# Patient Record
Sex: Female | Born: 1965 | Race: White | Hispanic: No | Marital: Married | Smoking: Current every day smoker
Health system: Southern US, Community
[De-identification: ages and names within clinical notes are randomized; demographics above are authoritative.]

## PROBLEM LIST (undated history)

## (undated) DIAGNOSIS — I219 Acute myocardial infarction, unspecified: Secondary | ICD-10-CM

## (undated) DIAGNOSIS — G610 Guillain-Barre syndrome: Secondary | ICD-10-CM

## (undated) DIAGNOSIS — I2510 Atherosclerotic heart disease of native coronary artery without angina pectoris: Secondary | ICD-10-CM

## (undated) DIAGNOSIS — G9050 Complex regional pain syndrome I, unspecified: Secondary | ICD-10-CM

## (undated) DIAGNOSIS — I10 Essential (primary) hypertension: Secondary | ICD-10-CM

## (undated) DIAGNOSIS — E119 Type 2 diabetes mellitus without complications: Secondary | ICD-10-CM

## (undated) DIAGNOSIS — Z720 Tobacco use: Secondary | ICD-10-CM

## (undated) DIAGNOSIS — E785 Hyperlipidemia, unspecified: Secondary | ICD-10-CM

## (undated) HISTORY — DX: Essential (primary) hypertension: I10

## (undated) HISTORY — DX: Tobacco use: Z72.0

## (undated) HISTORY — DX: Acute myocardial infarction, unspecified: I21.9

## (undated) HISTORY — PX: DILATION AND CURETTAGE OF UTERUS: SHX78

## (undated) HISTORY — DX: Complex regional pain syndrome I, unspecified: G90.50

## (undated) HISTORY — DX: Type 2 diabetes mellitus without complications: E11.9

## (undated) HISTORY — DX: Hyperlipidemia, unspecified: E78.5

## (undated) HISTORY — DX: Guillain-Barre syndrome: G61.0

## (undated) HISTORY — DX: Atherosclerotic heart disease of native coronary artery without angina pectoris: I25.10

---
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
## 1999-10-29 ENCOUNTER — Emergency Department (HOSPITAL_COMMUNITY): Admission: EM | Admit: 1999-10-29 | Discharge: 1999-10-29 | Payer: Self-pay | Admitting: Emergency Medicine

## 2000-04-27 ENCOUNTER — Other Ambulatory Visit: Admission: RE | Admit: 2000-04-27 | Discharge: 2000-04-27 | Payer: Self-pay | Admitting: Oncology

## 2000-12-06 HISTORY — PX: CARPAL TUNNEL RELEASE: SHX101

## 2003-12-07 HISTORY — PX: TUBAL LIGATION: SHX77

## 2004-10-12 ENCOUNTER — Ambulatory Visit (HOSPITAL_COMMUNITY): Admission: RE | Admit: 2004-10-12 | Discharge: 2004-10-12 | Payer: Self-pay | Admitting: Neurology

## 2004-12-06 HISTORY — PX: CHOLECYSTECTOMY: SHX55

## 2005-05-23 IMAGING — NM NM BONE 3 PHASE
2 series · 12 of 12 positions shown · non-contrast
Comparison: none

CLINICAL DATA: 38-year-old with left foot pain.  Patient has history of bed rest during her pregnancy.
 NM 3-PHASE BONE SCAN:
 The patient received 25 mCi 5c-FFm MDP IV.  Flow, immediate static and delayed images were obtained.  These are correlated with plain films of the foot.  
 The flow images demonstrate no definite asymmetry in the flow to either foot.  I do not see any significant abnormality on the immediate static images either.  However, on delayed images at 3 hours, there is marked diffuse abnormal periarticular uptake in the left ankle and foot and also in the left knee.  This, in combination with the plain film finding would be highly suggestive of a reflex sympathetic dystrophy (causalgia).

[Series 1: bf bone flow · 9.87mm/px · 6 of 48 frames shown (1 of 2)]
[frame 5/48]
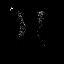
[frame 13/48]
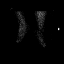
[frame 21/48]
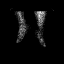
[frame 29/48]
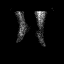
[frame 37/48]
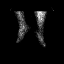
[frame 45/48]
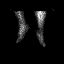

[Series 1: bf bone flow · 9.92mm/px · 6 of 48 frames shown (2 of 2)]
[frame 5/48]
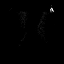
[frame 13/48]
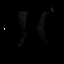
[frame 21/48]
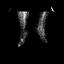
[frame 29/48]
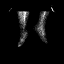
[frame 37/48]
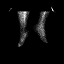
[frame 45/48]
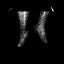

[12 of 12 positions shown; findings below may reference images not displayed]

IMPRESSION: 1.  Findings highly suggestive of a reflex sympathetic dystrophy involving the left lower extremity.

## 2008-12-06 HISTORY — PX: CORONARY STENT PLACEMENT: SHX1402

## 2009-06-05 HISTORY — PX: CARDIAC CATHETERIZATION: SHX172

## 2009-06-05 HISTORY — PX: CORONARY ANGIOPLASTY WITH STENT PLACEMENT: SHX49

## 2009-09-09 ENCOUNTER — Ambulatory Visit: Payer: Self-pay | Admitting: Cardiovascular Disease

## 2009-09-16 ENCOUNTER — Telehealth: Payer: Self-pay | Admitting: Cardiovascular Disease

## 2009-09-24 ENCOUNTER — Telehealth: Payer: Self-pay | Admitting: Cardiovascular Disease

## 2009-10-09 ENCOUNTER — Ambulatory Visit: Payer: Self-pay | Admitting: Cardiovascular Disease

## 2010-02-04 ENCOUNTER — Ambulatory Visit: Payer: Self-pay | Admitting: Cardiovascular Disease

## 2010-07-20 ENCOUNTER — Ambulatory Visit: Payer: Self-pay | Admitting: Cardiovascular Disease

## 2010-12-15 ENCOUNTER — Telehealth: Payer: Self-pay | Admitting: Cardiovascular Disease

## 2011-01-07 NOTE — Progress Notes (Signed)
  Phone Note Outgoing Call   Call placed by: Dessie Coma  LPN,  December 15, 2010 2:38 PM Call placed to: Patient Summary of Call: LMVM-for patient to return call.  Cholesterol is worse- is she still taking Lipitor?   Follow-up for Phone Call        Patient returned call- has been off of Lipitor because  of an ecoli  infection.  Just started back on Lipitor for 2 days. Follow-up by: Dessie Coma  LPN,  December 15, 2010 4:11 PM

## 2011-01-21 ENCOUNTER — Ambulatory Visit (INDEPENDENT_AMBULATORY_CARE_PROVIDER_SITE_OTHER): Payer: Medicare Other | Admitting: Cardiovascular Disease

## 2011-01-21 DIAGNOSIS — F17200 Nicotine dependence, unspecified, uncomplicated: Secondary | ICD-10-CM

## 2011-01-21 DIAGNOSIS — I2510 Atherosclerotic heart disease of native coronary artery without angina pectoris: Secondary | ICD-10-CM

## 2011-01-21 DIAGNOSIS — I10 Essential (primary) hypertension: Secondary | ICD-10-CM

## 2011-01-21 DIAGNOSIS — E7800 Pure hypercholesterolemia, unspecified: Secondary | ICD-10-CM

## 2011-02-26 NOTE — Assessment & Plan Note (Signed)
[facility]                       [place] CARDIOLOGY OFFICE NOTE  NAME:[name], [name]                       MRN:          [id number] DATE:01/21/2011                            DOB:          20-Dec-1965   [name] is a 45 year old female who is here today for a followup visit.  She has the following problem list: 1. Coronary artery disease status post acute myocardial infarction in     July 2010.  She is status post emergent angioplasty and a bare-     metal stent placement to the right coronary artery.  The stent was     Vision 4.0 x 23 mm. 2. Tobacco use. 3. Hypertension. 4. Type 2 diabetes. 5. Hyperlipidemia. 6. Reflexive sympathetic dystrophy.  INTERVAL HISTORY:  Ms. Kirksey overall has been doing reasonably well since her last visit.  Unfortunately, she is smoking again but not as much as before.  She smokes one-third of a pack per day.  She is not able to quit at this time.  She had only one episode of chest pain in the last 6 months that required nitroglycerin.  She has chronic exertional dyspnea.  She was off Lipitor for few weeks while she was taken antibiotic due to an interaction.  She is back on it right now.  MEDICATIONS: 1. Protonix 40 mg once daily. 2. Furosemide 20 mg once daily. 3. Lipitor 80 mg once daily. 4. Metoprolol succinate 25 mg once daily. 5. Fentanyl patch. 6. Insulin. 7. Lisinopril 2.5 mg once daily which will be increased today to 10 mg     once daily. 8. Fish oil 1200 mg once daily. 9. Folic acid once daily. 10.Aspirin 325 mg once daily.  PHYSICAL EXAMINATION:  VITAL SIGNS:  Weight is 168.8 pounds, blood pressure is 155/65, pulse is 63, and oxygen saturation is 98% on room air. NECK:  No JVD or carotid bruits. LUNGS:  Clear to auscultation. HEART:  Regular rate and rhythm with no gallops or murmurs. ABDOMEN:  Benign, nontender, nondistended. EXTREMITIES:  With no clubbing, cyanosis, or  edema.  IMPRESSION: 1. Coronary artery disease:  She seems to be stable at this time with     no symptoms suggestive of angina.  We will continue with current     medications. 2. Hypertension:  Blood pressure is elevated.  She is only on a small     dose lisinopril and thus we will increase the dose to 10 mg once     daily. 3. Hyperlipidemia:  Her most recent lipid profile was done while she     was not taking Lipitor and that probably explains why it was     significantly worse than before.  Her lipid profile was within     acceptable range while she was taking Lipitor.  It is will be     rechecked with her primary care physician in few months. 4. Tobacco use:  The patient was counseled about the importance of     smoking cessation.  She will follow up with me in 6 months from now     or earlier if needed.  Lorine Bears, MD Electronically Signed   MA/MedQ  DD: 01/21/2011  DT: 01/22/2011  Job #: [id number]

## 2011-04-15 ENCOUNTER — Other Ambulatory Visit: Payer: Self-pay | Admitting: Cardiovascular Disease

## 2011-04-20 NOTE — Assessment & Plan Note (Signed)
[place] [facility]                        [place] CARDIOLOGY OFFICE NOTE   NAME:[name]                       MRN:          [id number]  DATE:07/20/2010                            DOB:          [birth year]    PROBLEM LIST:  1. Coronary artery disease status post acute myocardial infarction in      July of 2010.  Status post emergent angioplasty and a bare-metal      stent placement to the right coronary artery.  The stent was Vision      stent 4.0 x 23 mm.  2. Hypertension.  3. Type 2 diabetes.  4. Hyperlipidemia.  5. Reflex sympathetic dystrophy.   INTERVAL HISTORY:  The patient is here today for routine followup visit.  Overall, she has been doing very well from a cardiac standpoint.  She  denies any chest pain, dyspnea, palpitations, dizziness, presyncope, or  syncope.  She quit smoking about 6 months ago.  However, she  occasionally smokes a cigarette here and there.  She is not able to do  much physical exercise due to RSD in the left leg.   CURRENT MEDICATIONS:  1. Protonix 40 mg daily.  2. Furosemide 20 mg daily.  3. Lipitor 80 mg once daily.  4. Metoprolol succinate 25 mg once daily.  5. Fentanyl patch.  6. Lyrica 100 mg twice daily.  7. Insulin sliding scale.  8. Insulin Levemir 60 units twice daily.  9. Aspirin 325 mg once daily.  10.Lisinopril 2.5 mg once daily.  11.Fish oil 1200 mg once daily.  12.Folic acid once daily.   ALLERGIES:  No known drug allergies.   PHYSICAL EXAMINATION:  GENERAL:  Overall, she appears to be in no acute  distress.  VITAL SIGNS:  Weight is 166.8 pounds, blood pressure is 153/64, pulse is  68, oxygen saturation is 92% on room air.  NECK:  No JVD or carotid bruits.  LUNGS:  Clear to auscultation and percussion.  CARDIOVASCULAR:  Regular rate and rhythm with no gallops or murmurs.  ABDOMEN:  Benign, nontender, nondistended.  EXTREMITIES:  With no clubbing, cyanosis or edema.  The left leg is  extremely sensitive to touch.  This is due to her known history of RSD.   IMPRESSION:  1. Coronary artery disease:  The patient is not having any symptoms of      angina at this time.  She seems to be doing well.  We will continue      with daily aspirin, beta-blocker, angiotensin-converting enzyme      inhibitor, statin, and fish oil.  2. Hypertension:  Blood pressure is well controlled and we will      continue with current management.  3. Hyperlipidemia:  Her most recent lipid profile showed an LDL of 55,      HDL of 70, and triglyceride of 75.  Obviously, this is under      excellent control.  We will continue with high-dose Lipitor.  4. Type 2 diabetes.  This has not been under well controlled, but is      being managed by her primary care  physician and seems to be      gradually improving.  The patient will return in 6 months from now      or earlier if needed.     Lorine Bears, MD  Electronically Signed    MA/MedQ  DD: 07/20/2010  DT: 07/21/2010  Job #: [id number]

## 2011-04-20 NOTE — Letter (Signed)
September 09, 2009    Philemon Kingdom, MD  [address]  [place] [address]   RE:  [name], [name]  MRN:  [id number]  /  DOB:  03/10/1966   Dear Dr. Sudie Bailey:   I had the pleasure of seeing your patient [name] in cardiology  clinic today.  As you know, she is a 45 year old white female who in  July 2010 had a myocardial infarction and subsequent percutaneous  intervention with a stent placed in her right coronary artery.  Patient  states that since that time, she has had intermittent chest discomfort  and diaphoresis not necessarily associated with one another.  Although I  doubt the likelihood of these symptoms representing angina, we will  proceed with a dobutamine echocardiogram in order to provide objective  evidence.  I will see her back in clinic after the results of the stress  test are available, and we will discuss potential modification of her  antiplatelet and antianginal regimens.   Thank you for the referral of this patient.  I look forward to following  her along with you.    Sincerely,      Brayton El, MD  Electronically Signed    SGA/MedQ  DD: 09/09/2009  DT: 09/09/2009  Job #: [id number]

## 2011-04-20 NOTE — Assessment & Plan Note (Signed)
HEALTHCARE                        [place] CARDIOLOGY OFFICE NOTE   NAME:Essman, GENEVA BARRERO                       MRN:          [id number]  DATE:10/09/2009                            DOB:          Jan 13, 1966    PROBLEM LIST:  1. Coronary artery disease status post myocardial infarction in July      2010 with a Vision bare-metal stent (4 x 23 mm) to her right      coronary artery.  2. Hypertension.  3. Diabetes.  4. Hyperlipidemia.  5. Reflex sympathetic dystrophy.   INTERVAL HISTORY:  The patient states since her last visit she has done  quite well.  She had a nuclear stress test at that showed a normal EF  and a fixed anterior septal defect with no evidence of inducible  ischemia.  She states that she has not used any nitroglycerin at all  since her last visit and has not had any episodes of chest discomfort.  She continues to get diaphoresis and hot flashes almost on a daily  basis.  She continues to have pain in her left lower extremity secondary  to the reflex sympathetic dystrophy.  She denies any increase in lower  extremity edema.  She continues to smoke approximately half pack of  cigarettes a day as she states this is one of the few things that she  still enjoys.  She is requesting that any medication that she does not  need be stopped secondary to financial constraints.   SOCIAL HISTORY:  The patient continues to smoke half pack of cigarettes  and does not use alcohol.   FAMILY HISTORY:  Negative for premature coronary artery disease.   ALLERGIES AND MEDICATIONS:  As in the chart.   PHYSICAL EXAMINATION:  VITAL SIGNS:  She has a blood pressure of 121/59,  pulse 74, weight 174, she is sating 94% on room air.  GENERAL:  No acute distress.  HEENT:  Nonfocal.  NECK:  Supple.  There is no JVD.  There are no carotid bruits.  HEART:  Regular rate and rhythm without murmur, rub or gallop.  LUNGS:  Clear bilaterally.  ABDOMEN:  Soft,  nontender, nondistended.  EXTREMITIES:  There is no lower extremity edema.  SKIN:  Warm and dry.  PSYCHIATRIC:  The patient is appropriate with normal levels of insight.   Review of the patient's stress test, as above in the problem list.  It  should be noted that there was a fixed anterior septal defect; however,  it is most likely secondary to breast attenuation.  Her ejection  fraction was 75%.   ASSESSMENT AND PLAN:  1. Coronary artery disease.  The patient is on aspirin, Effient, beta-      blocker, ACE inhibitor, and nystatin.  The patient may stop taking      her Effient as she is greater than 3 months out from placement of      bare-metal stent.  She should continue on aspirin, however.  2. Hypertension.  Her blood pressure is under good control.  She      should continue metoprolol and  lisinopril at current doses.  3. Hyperlipidemia.  The patient is on Lipitor 80 mg daily.  We asked      that she bring her last fasting cholesterol profile to [place] for our      review.  4. Tobacco use.  The patient was counseled regarding tobacco      cessation.  She states she has tried Chantix in the past, but was      not able to tolerate secondary to nightmares.  Wellbutrin also was      not successful.  She states at this point she is not ready to quit      smoking as she drives too much enjoyment from it.  5. Diabetes.  The patient states her blood sugars are still under      great control.  We recommend that she either followup with her      primary care physician more regularly or see an endocrinologist to      improve blood sugar control.  The patient was given samples of      Lipitor today in clinic.  We will see her back in 4 months' time      unless problem were to arise in the interim.     Brayton El, MD  Electronically Signed    SGA/MedQ  DD: 10/09/2009  DT: 10/10/2009  Job #: [id number]

## 2011-04-20 NOTE — Assessment & Plan Note (Signed)
[facility]                        [place] CARDIOLOGY OFFICE NOTE   NAME:[name], [name]                       MRN:          [id number]  DATE:09/09/2009                            DOB:          [birth year]    CHIEF COMPLAINT:  Establishing cardiovascular care here.   HISTORY OF PRESENT ILLNESS:  [name] is a 45 year old white female  with past medical history significant for coronary artery disease status  post acute myocardial infarction in July 2010 with subsequent placement  of a Vision bare-metal stent (4 x 23 mm) to her right coronary artery,  hypertension, diabetes, hyperlipidemia, reflex sympathetic dystrophy who  is presenting to establish cardiovascular care.  The patient states that  in July 2010, she had acute onset of chest discomfort and diaphoresis  that led to her receiving a bare-metal stent in her right coronary  artery.  Per review of the hospital record, her troponin peaked at 6.  Her ejection fraction at that time was 60%.  The patient states that  since her percutaneous intervention, she has had persistent intermittent  chest discomfort as well as a diaphoresis; however, the 2 symptoms are  not associated with each other.  They happen several times a week.  She  states over the past 4 months, she maybe used nitroglycerin 8 times.  However, when she used it, it relieved the chest pain promptly.  She  endorses some chronic mild lower extremity edema, but denies any PND or  orthopnea.  Of note, she used to use a wheelchair secondary to  difficulty ambulation ambulating from the RSD.  However, she currently  is able to ambulate successfully without assistance.   PAST MEDICAL HISTORY:  As above in HPI.  The patient also carries a  diagnosis of myelodysplastic syndrome, unspecified.   SOCIAL HISTORY:  The patient continues to smoke about a pack of  cigarettes a day and does not use any alcohol.   FAMILY HISTORY:  Negative for  premature coronary artery disease.   ALLERGIES:  No known drug allergies.   MEDICATIONS:  1. Aspirin 325 daily.  2. Effient 10 mg daily.  3. Lipitor 80 mg daily.  4. Furosemide 20 mg daily.  5. Pantoprazole 40 mg daily.  6. Fentanyl patch.  7. Metoprolol succinate ER 25 mg daily.  8. Lisinopril 2.5 mg daily.  9. Humulin insulin.  10.Lyrica 100 mg daily.  11.Nitroglycerin p.r.n.  12.Oxycodone p.r.n.   REVIEW OF SYSTEMS:  Positive for occasional headaches, lower extremity  edema, frequent pain in her legs.  Other systems as in HPI, otherwise  negative.   PHYSICAL EXAMINATION:  VITAL SIGNS:  She has a blood pressure of 133/56,  heart rate is 72, weight is 176 pounds.  GENERAL:  No acute distress.  HEENT:  Normocephalic, atraumatic.  NECK:  Supple.  There is no JVD.  HEART:  Distant heart sounds.  Regular rate and rhythm without murmur,  rub or gallop.  LUNGS:  Clear bilaterally.  ABDOMEN:  Soft, nontender, nondistended.  EXTREMITIES:  Somewhat tender to palpation with a trace bilateral lower  extremity edema.  SKIN:  Warm and dry without evidence of rash.  PSYCHIATRIC:  The patient is appropriate with normal levels of insight.  MUSCULOSKELETAL:  The patient has 5/5 bilateral upper and lower  extremity strength.   EKG from today independently interpreted by myself demonstrates normal  sinus rhythm without any ST or T-wave abnormalities.  The patient's labs  are currently not available for review.   The patient's hospital course is as above in HPI.   ASSESSMENT:  A 45 year old white female with known cardiovascular  disease and persistent tobacco use, presenting with continued  intermittent chest discomfort and some occasional diaphoresis.  The  patient states these symptoms have been present since the time of the  heart catheterization.  Therefore, I am less inclined to think they are  actually secondary to angina.  However, the patient discontinued to  smoke and has  significant risk factors for progression of coronary  artery disease.   PLAN:  We will order a dobutamine echocardiogram in order to reevaluate  the patient's left ventricular ejection fraction and to rule out  inducible ischemia.  She should continue on her current medical regimen  until that time.  Of note, the patient received a bare-metal stent and  is still on full dose aspirin and Effient.  Over the next office visit,  we will discuss discontinuing Effient.  However, we will wait for the  results of stress test.  We will ask the patient to have her primary  care physician fax her labs including a fasting lipid profile for our  review.     Brayton El, MD  Electronically Signed    SGA/MedQ  DD: 09/09/2009  DT: 09/10/2009  Job #: [id number]

## 2011-04-20 NOTE — Assessment & Plan Note (Signed)
[place] [facility]                        [place] CARDIOLOGY OFFICE NOTE   NAME:[name], [name]                       MRN:          [id number]  DATE:02/04/2010                            DOB:          10-Jul-1966    PROBLEM LIST:  1. Coronary artery disease, status post myocardial infarction in July      2010 with a Vision bare metal stent (4 x 23 mm) to the right      coronary artery.  2. Hypertension.  3. Diabetes.  4. Hyperlipidemia.  5. Reflex sympathetic dystrophy.   INTERVAL HISTORY:  The patient states since her last visit she had been  getting along well.  She denies any chest discomfort, dyspnea on  exertion, or any change in her chronic lower extremity edema.  Her  activity continues to be limited by pain from the reflex sympathetic  dystrophy in her left leg.  She states that she quit smoking several  days ago for her husband's birthday and is making significant efforts to  sustain that.  She has denied medical intervention for the tobacco  cessation in the past.   PHYSICAL EXAMINATION:  VITAL SIGNS:  Her blood pressure is 127/65, pulse  is 71, she is sating 95% on room air, and she weighs 183 pounds which is  about 8 pounds more than she weighed in November 2010.  GENERAL:  No acute distress.  HEENT:  Normocephalic, atraumatic.  NECK:  Supple.  There is no JVD in the seated position.  There are no  carotid bruits.  HEART:  Regular rate and rhythm without murmur, rub, or gallop.  LUNGS:  Clear bilaterally.  ABDOMEN:  Soft, nontender.  EXTREMITIES:  Trace bilateral lower extremity edema.  SKIN:  Warm and dry.  Pulses 2+ bilateral radial and posterior tibial  pulses.  PSYCHIATRIC:  The patient is appropriate.   Review of the patient's labs from December 2010, triglycerides 75, HDL  70, LDL 55, total cholesterol 027.   ASSESSMENT AND PLAN:  1. Coronary artery disease.  The patient is not having any angina.      She should continue on  aspirin, beta-blocker, ACE inhibitor,      statin, and fish oil.  The patient states she is taking 1200 a day      of fish oil.  We asked that she check to make sure she is taking at      least 1000 mg of a combination of EPA and DHA.  2. Hypertension.  Her blood pressure is under excellent control.  She      should continue with current medications.  3. Hyperlipidemia.  Her lipid profile is excellent.  She should      continue on Lipitor 80 mg daily.  She is given a co-pay card and      couple weeks samples today.  4. Tobacco use.  The patient is congratulated on efforts she is making      to stop tobacco use and encouraged to continue with this.  5. Diabetes.  The patient's blood glucose control has been difficult  per her primary care physician.  She should continue to follow up      regularly with Dr. Sudie Bailey regarding this issue.  6. Weight gain.  The patient is encouraged to perform some physical      activity as the pain in her left leg allows.  She is also      encouraged to continue efforts such as dietary modification.  She      has seen many nutritionists in the past.  We will see the patient      back in 4 months' time unless if problem were to arise in the      interim.     Brayton El, MD  Electronically Signed    SGA/MedQ  DD: 02/04/2010  DT: 02/05/2010  Job #: [id number]   cc:   Philemon Kingdom

## 2011-07-05 ENCOUNTER — Encounter: Payer: Self-pay | Admitting: Cardiovascular Disease

## 2011-07-08 ENCOUNTER — Encounter: Payer: Self-pay | Admitting: Cardiovascular Disease

## 2011-07-15 ENCOUNTER — Ambulatory Visit: Payer: BC Managed Care – PPO | Admitting: Cardiovascular Disease

## 2011-07-19 ENCOUNTER — Ambulatory Visit: Payer: BC Managed Care – PPO | Admitting: Cardiovascular Disease

## 2011-07-22 ENCOUNTER — Encounter: Payer: Self-pay | Admitting: Cardiovascular Disease

## 2011-07-22 ENCOUNTER — Ambulatory Visit (INDEPENDENT_AMBULATORY_CARE_PROVIDER_SITE_OTHER): Payer: Medicare Other | Admitting: Cardiovascular Disease

## 2011-07-22 DIAGNOSIS — Z720 Tobacco use: Secondary | ICD-10-CM | POA: Insufficient documentation

## 2011-07-22 DIAGNOSIS — E785 Hyperlipidemia, unspecified: Secondary | ICD-10-CM | POA: Insufficient documentation

## 2011-07-22 DIAGNOSIS — I10 Essential (primary) hypertension: Secondary | ICD-10-CM

## 2011-07-22 DIAGNOSIS — R079 Chest pain, unspecified: Secondary | ICD-10-CM

## 2011-07-22 DIAGNOSIS — I2510 Atherosclerotic heart disease of native coronary artery without angina pectoris: Secondary | ICD-10-CM | POA: Insufficient documentation

## 2011-07-22 DIAGNOSIS — F17200 Nicotine dependence, unspecified, uncomplicated: Secondary | ICD-10-CM

## 2011-07-22 NOTE — Assessment & Plan Note (Signed)
I discussed with her again the importance of smoking cessation. Unfortunately, she's not able to quit at this time due to significant stress at home.

## 2011-07-22 NOTE — Patient Instructions (Signed)
Your physician recommends that you schedule a follow-up appointment in: 6 months  Your physician has requested that you have a lexiscan myoview. For further information please visit [email or web address] Please follow instruction sheet, as given.  ---- 08/03/11 @ 8:45

## 2011-07-22 NOTE — Assessment & Plan Note (Signed)
His blood pressure is well controlled. Continue current medications. Lisinopril was increased during her last visit to 10 mg daily.

## 2011-07-22 NOTE — Assessment & Plan Note (Signed)
Continue high dose atorvastatin. This is being managed by her primary care physician. Goal LDL is less than 70.

## 2011-07-22 NOTE — Assessment & Plan Note (Signed)
The patient is having increased frequency of chest pain. Some of it seems to be atypical and might be related to anxiety. However, she's been having increased exertional chest pain and dyspnea as well. Thus, I recommend further evaluation with a pharmacologic nuclear stress test. The patient is not able to exercise on a treadmill due to her known history of reflexive sympathetic dystrophy.

## 2011-07-22 NOTE — Progress Notes (Signed)
HPI  This is a 45 year old female who is here today for a followup visit. She has known history of coronary artery disease status post inferior myocardial infarction in July 2010. At that time she had an angioplasty in bare-metal stent placement to the right coronary artery. The patient has been having more episodes of chest pain recently. She describes substernal and left-sided sharp discomfort which is triggered by stress and anxiety. She has been under a lot of stress at home. She also describes exertional chest discomfort and dyspnea but according to her this has been chronic even before his myocardial infarction. She has not had any recent stress testing. Her most recent stress test was in October 2010 which showed no evidence of ischemia with normal ejection fraction. Unfortunately, she continues to smoke half a pack per day and is not able to quit at this time.  No Known Allergies   Current Outpatient Prescriptions on File Prior to Visit  Medication Sig Dispense Refill  . aspirin 325 MG tablet Take 325 mg by mouth daily.        Marland Kitchen atorvastatin (LIPITOR) 80 MG tablet Take 80 mg by mouth every evening.        . clonazePAM (KLONOPIN) 0.5 MG tablet Take 0.5 mg by mouth 2 (two) times daily as needed.        . folic acid (FOLVITE) 400 MCG tablet Take 400 mcg by mouth daily.        . furosemide (LASIX) 20 MG tablet Take 20 mg by mouth daily.        . insulin detemir (LEVEMIR) 100 UNIT/ML injection Inject 60 Units into the skin 2 (two) times daily.        . insulin regular (HUMULIN R,NOVOLIN R) 100 units/mL injection Twice daily & as needed       . nitroGLYCERIN (NITROSTAT) 0.4 MG SL tablet Place 0.4 mg under the tongue every 5 (five) minutes as needed.        . Omega-3 Fatty Acids (FISH OIL) 1200 MG CAPS Take by mouth daily.        Marland Kitchen oxyCODONE (ROXICODONE) 15 MG immediate release tablet Take 15 mg by mouth every 6 (six) hours as needed.        . pantoprazole (PROTONIX) 40 MG tablet Take 40 mg by  mouth daily.        . TOPROL XL 25 MG 24 hr tablet TAKE 1 TABLET ONCE DAILY  30 each  6     Past Medical History  Diagnosis Date  . MI (myocardial infarction)   . DM2 (diabetes mellitus, type 2)   . Sympathetic reflex dystrophy   . CAD (coronary artery disease)     s/p MI & stenting  . HLD (hyperlipidemia)   . HTN (hypertension)   . Tobacco abuse      Past Surgical History  Procedure Date  . Cholecystectomy 2006  . Carpal tunnel release 2002    left  . Dilation and curettage of uterus 1992 & 2004  . Tonsillectomy 1971  . Tubal ligation 2005  . Coronary stent placement 2010  . Cardiac catheterization 06/05/2009    Inferior MI. Severe 1 vessel CAD (RCA)  . Coronary angioplasty with stent placement 06/05/2009    BMS (4.0 X 23 mm) Vision stent to RCA     Family History  Problem Relation Age of Onset  . Huntington's disease       History   Social History  . Marital Status: Married  Spouse Name: N/A    Number of Children: 2  . Years of Education: N/A   Occupational History  . disabled    Social History Main Topics  . Smoking status: Current Everyday Smoker -- 0.8 packs/day for 25 years  . Smokeless tobacco: Not on file  . Alcohol Use: No  . Drug Use: No  . Sexually Active: Not on file   Other Topics Concern  . Not on file   Social History Narrative  . No narrative on file       PHYSICAL EXAM   BP 132/64  Pulse 62  Ht 4\' 10"  (1.473 m)  Wt 163 lb (73.936 kg)  BMI 34.07 kg/m2  SpO2 95%  Constitutional: She is oriented to person, place, and time. She appears well-developed and well-nourished. No distress.  HENT: No nasal discharge.  Head: Normocephalic and atraumatic.  Eyes: Pupils are equal, round, and reactive to light. Right eye exhibits no discharge. Left eye exhibits no discharge.  Neck: Normal range of motion. Neck supple. No JVD present. No thyromegaly present.  Cardiovascular: Normal rate, regular rhythm, normal heart sounds and  intact distal pulses. Exam reveals no gallop and no friction rub.  No murmur heard.  Pulmonary/Chest: Effort normal and breath sounds normal. No stridor. No respiratory distress. She has no wheezes. She has no rales. She exhibits no tenderness.  Abdominal: Soft. Bowel sounds are normal. She exhibits no distension. There is no tenderness. There is no rebound and no guarding.  Musculoskeletal: Normal range of motion. She exhibits no edema and no tenderness.  Neurological: She is alert and oriented to person, place, and time. Coordination normal.  Skin: Skin is warm and dry. No rash noted. She is not diaphoretic. No erythema. No pallor.  Psychiatric: She has a normal mood and affect. Her behavior is normal. Judgment and thought content normal.     EKG: Normal sinus rhythm with no significant ST or T wave changes.   ASSESSMENT AND PLAN

## 2011-07-26 ENCOUNTER — Other Ambulatory Visit: Payer: Self-pay | Admitting: Cardiovascular Disease

## 2011-07-27 ENCOUNTER — Encounter: Payer: Self-pay | Admitting: *Deleted

## 2011-08-04 ENCOUNTER — Encounter: Payer: Self-pay | Admitting: Cardiovascular Disease

## 2011-08-05 ENCOUNTER — Encounter: Payer: Self-pay | Admitting: Cardiovascular Disease

## 2011-08-28 ENCOUNTER — Other Ambulatory Visit: Payer: Self-pay | Admitting: Cardiovascular Disease

## 2011-08-29 ENCOUNTER — Other Ambulatory Visit: Payer: Self-pay | Admitting: *Deleted

## 2011-08-29 MED ORDER — ATORVASTATIN CALCIUM 80 MG PO TABS: 80.0000 mg | ORAL_TABLET | Freq: Every evening | ORAL | Status: AC

## 2011-12-04 ENCOUNTER — Other Ambulatory Visit: Payer: Self-pay | Admitting: Cardiovascular Disease

## 2012-01-05 DIAGNOSIS — M545 Low back pain, unspecified: Secondary | ICD-10-CM | POA: Diagnosis not present

## 2012-01-05 DIAGNOSIS — R5381 Other malaise: Secondary | ICD-10-CM | POA: Diagnosis not present

## 2012-01-05 DIAGNOSIS — G988 Other disorders of nervous system: Secondary | ICD-10-CM | POA: Diagnosis not present

## 2012-01-05 DIAGNOSIS — IMO0002 Reserved for concepts with insufficient information to code with codable children: Secondary | ICD-10-CM | POA: Diagnosis not present

## 2012-01-05 DIAGNOSIS — R5383 Other fatigue: Secondary | ICD-10-CM | POA: Diagnosis not present

## 2012-02-02 DIAGNOSIS — G90529 Complex regional pain syndrome I of unspecified lower limb: Secondary | ICD-10-CM | POA: Diagnosis not present

## 2012-02-02 DIAGNOSIS — E1149 Type 2 diabetes mellitus with other diabetic neurological complication: Secondary | ICD-10-CM | POA: Diagnosis not present

## 2012-02-02 DIAGNOSIS — E1142 Type 2 diabetes mellitus with diabetic polyneuropathy: Secondary | ICD-10-CM | POA: Diagnosis not present

## 2012-02-02 DIAGNOSIS — G988 Other disorders of nervous system: Secondary | ICD-10-CM | POA: Diagnosis not present

## 2012-03-16 DIAGNOSIS — M25569 Pain in unspecified knee: Secondary | ICD-10-CM | POA: Diagnosis not present

## 2012-03-16 DIAGNOSIS — M6750 Plica syndrome, unspecified knee: Secondary | ICD-10-CM | POA: Diagnosis not present

## 2012-03-16 DIAGNOSIS — M1710 Unilateral primary osteoarthritis, unspecified knee: Secondary | ICD-10-CM | POA: Diagnosis not present

## 2012-03-16 DIAGNOSIS — M7120 Synovial cyst of popliteal space [Baker], unspecified knee: Secondary | ICD-10-CM | POA: Diagnosis not present

## 2012-04-03 DIAGNOSIS — IMO0002 Reserved for concepts with insufficient information to code with codable children: Secondary | ICD-10-CM | POA: Diagnosis not present

## 2012-04-03 DIAGNOSIS — G90529 Complex regional pain syndrome I of unspecified lower limb: Secondary | ICD-10-CM | POA: Diagnosis not present

## 2012-04-03 DIAGNOSIS — G988 Other disorders of nervous system: Secondary | ICD-10-CM | POA: Diagnosis not present

## 2012-04-03 DIAGNOSIS — E1149 Type 2 diabetes mellitus with other diabetic neurological complication: Secondary | ICD-10-CM | POA: Diagnosis not present

## 2012-04-12 DIAGNOSIS — G587 Mononeuritis multiplex: Secondary | ICD-10-CM | POA: Diagnosis not present

## 2012-04-26 DIAGNOSIS — G587 Mononeuritis multiplex: Secondary | ICD-10-CM | POA: Diagnosis not present

## 2012-04-26 DIAGNOSIS — G608 Other hereditary and idiopathic neuropathies: Secondary | ICD-10-CM | POA: Diagnosis not present

## 2012-04-28 DIAGNOSIS — G603 Idiopathic progressive neuropathy: Secondary | ICD-10-CM | POA: Diagnosis not present

## 2012-05-02 DIAGNOSIS — E1142 Type 2 diabetes mellitus with diabetic polyneuropathy: Secondary | ICD-10-CM | POA: Diagnosis not present

## 2012-05-02 DIAGNOSIS — G988 Other disorders of nervous system: Secondary | ICD-10-CM | POA: Diagnosis not present

## 2012-05-02 DIAGNOSIS — IMO0001 Reserved for inherently not codable concepts without codable children: Secondary | ICD-10-CM | POA: Diagnosis not present

## 2012-05-02 DIAGNOSIS — E1149 Type 2 diabetes mellitus with other diabetic neurological complication: Secondary | ICD-10-CM | POA: Diagnosis not present

## 2012-06-06 DIAGNOSIS — G603 Idiopathic progressive neuropathy: Secondary | ICD-10-CM | POA: Diagnosis not present

## 2012-06-06 DIAGNOSIS — E1142 Type 2 diabetes mellitus with diabetic polyneuropathy: Secondary | ICD-10-CM | POA: Diagnosis not present

## 2012-06-14 DIAGNOSIS — G587 Mononeuritis multiplex: Secondary | ICD-10-CM | POA: Diagnosis not present

## 2012-06-14 DIAGNOSIS — M6281 Muscle weakness (generalized): Secondary | ICD-10-CM | POA: Diagnosis not present

## 2012-06-14 DIAGNOSIS — E1142 Type 2 diabetes mellitus with diabetic polyneuropathy: Secondary | ICD-10-CM | POA: Diagnosis not present

## 2012-06-14 DIAGNOSIS — G603 Idiopathic progressive neuropathy: Secondary | ICD-10-CM | POA: Diagnosis not present

## 2012-06-19 DIAGNOSIS — E1142 Type 2 diabetes mellitus with diabetic polyneuropathy: Secondary | ICD-10-CM | POA: Diagnosis not present

## 2012-06-19 DIAGNOSIS — G603 Idiopathic progressive neuropathy: Secondary | ICD-10-CM | POA: Diagnosis not present

## 2012-06-19 DIAGNOSIS — G587 Mononeuritis multiplex: Secondary | ICD-10-CM | POA: Diagnosis not present

## 2012-06-19 DIAGNOSIS — M6281 Muscle weakness (generalized): Secondary | ICD-10-CM | POA: Diagnosis not present

## 2012-06-26 DIAGNOSIS — G603 Idiopathic progressive neuropathy: Secondary | ICD-10-CM | POA: Diagnosis not present

## 2012-06-26 DIAGNOSIS — G587 Mononeuritis multiplex: Secondary | ICD-10-CM | POA: Diagnosis not present

## 2012-06-26 DIAGNOSIS — M6281 Muscle weakness (generalized): Secondary | ICD-10-CM | POA: Diagnosis not present

## 2012-06-26 DIAGNOSIS — E1142 Type 2 diabetes mellitus with diabetic polyneuropathy: Secondary | ICD-10-CM | POA: Diagnosis not present

## 2012-06-28 DIAGNOSIS — G603 Idiopathic progressive neuropathy: Secondary | ICD-10-CM | POA: Diagnosis not present

## 2012-06-28 DIAGNOSIS — G587 Mononeuritis multiplex: Secondary | ICD-10-CM | POA: Diagnosis not present

## 2012-06-28 DIAGNOSIS — E1142 Type 2 diabetes mellitus with diabetic polyneuropathy: Secondary | ICD-10-CM | POA: Diagnosis not present

## 2012-06-28 DIAGNOSIS — M6281 Muscle weakness (generalized): Secondary | ICD-10-CM | POA: Diagnosis not present

## 2012-07-03 DIAGNOSIS — M6281 Muscle weakness (generalized): Secondary | ICD-10-CM | POA: Diagnosis not present

## 2012-07-03 DIAGNOSIS — E1142 Type 2 diabetes mellitus with diabetic polyneuropathy: Secondary | ICD-10-CM | POA: Diagnosis not present

## 2012-07-03 DIAGNOSIS — G603 Idiopathic progressive neuropathy: Secondary | ICD-10-CM | POA: Diagnosis not present

## 2012-07-03 DIAGNOSIS — G587 Mononeuritis multiplex: Secondary | ICD-10-CM | POA: Diagnosis not present

## 2012-07-04 DIAGNOSIS — G988 Other disorders of nervous system: Secondary | ICD-10-CM | POA: Diagnosis not present

## 2012-07-04 DIAGNOSIS — M545 Low back pain, unspecified: Secondary | ICD-10-CM | POA: Diagnosis not present

## 2012-07-04 DIAGNOSIS — IMO0002 Reserved for concepts with insufficient information to code with codable children: Secondary | ICD-10-CM | POA: Diagnosis not present

## 2012-07-10 DIAGNOSIS — E1142 Type 2 diabetes mellitus with diabetic polyneuropathy: Secondary | ICD-10-CM | POA: Diagnosis not present

## 2012-07-10 DIAGNOSIS — E1149 Type 2 diabetes mellitus with other diabetic neurological complication: Secondary | ICD-10-CM | POA: Diagnosis not present

## 2012-07-12 DIAGNOSIS — M6281 Muscle weakness (generalized): Secondary | ICD-10-CM | POA: Diagnosis not present

## 2012-07-12 DIAGNOSIS — G603 Idiopathic progressive neuropathy: Secondary | ICD-10-CM | POA: Diagnosis not present

## 2012-07-12 DIAGNOSIS — G587 Mononeuritis multiplex: Secondary | ICD-10-CM | POA: Diagnosis not present

## 2012-07-12 DIAGNOSIS — E1142 Type 2 diabetes mellitus with diabetic polyneuropathy: Secondary | ICD-10-CM | POA: Diagnosis not present

## 2012-07-17 DIAGNOSIS — E1142 Type 2 diabetes mellitus with diabetic polyneuropathy: Secondary | ICD-10-CM | POA: Diagnosis not present

## 2012-07-17 DIAGNOSIS — G603 Idiopathic progressive neuropathy: Secondary | ICD-10-CM | POA: Diagnosis not present

## 2012-07-17 DIAGNOSIS — M6281 Muscle weakness (generalized): Secondary | ICD-10-CM | POA: Diagnosis not present

## 2012-07-17 DIAGNOSIS — G587 Mononeuritis multiplex: Secondary | ICD-10-CM | POA: Diagnosis not present

## 2012-07-26 DIAGNOSIS — G587 Mononeuritis multiplex: Secondary | ICD-10-CM | POA: Diagnosis not present

## 2012-07-26 DIAGNOSIS — M6281 Muscle weakness (generalized): Secondary | ICD-10-CM | POA: Diagnosis not present

## 2012-07-26 DIAGNOSIS — G603 Idiopathic progressive neuropathy: Secondary | ICD-10-CM | POA: Diagnosis not present

## 2012-07-26 DIAGNOSIS — E1142 Type 2 diabetes mellitus with diabetic polyneuropathy: Secondary | ICD-10-CM | POA: Diagnosis not present

## 2012-08-01 DIAGNOSIS — M6281 Muscle weakness (generalized): Secondary | ICD-10-CM | POA: Diagnosis not present

## 2012-08-01 DIAGNOSIS — G587 Mononeuritis multiplex: Secondary | ICD-10-CM | POA: Diagnosis not present

## 2012-08-01 DIAGNOSIS — E1142 Type 2 diabetes mellitus with diabetic polyneuropathy: Secondary | ICD-10-CM | POA: Diagnosis not present

## 2012-08-01 DIAGNOSIS — G603 Idiopathic progressive neuropathy: Secondary | ICD-10-CM | POA: Diagnosis not present

## 2012-08-03 DIAGNOSIS — M6281 Muscle weakness (generalized): Secondary | ICD-10-CM | POA: Diagnosis not present

## 2012-08-03 DIAGNOSIS — G603 Idiopathic progressive neuropathy: Secondary | ICD-10-CM | POA: Diagnosis not present

## 2012-08-03 DIAGNOSIS — E1142 Type 2 diabetes mellitus with diabetic polyneuropathy: Secondary | ICD-10-CM | POA: Diagnosis not present

## 2012-08-03 DIAGNOSIS — G587 Mononeuritis multiplex: Secondary | ICD-10-CM | POA: Diagnosis not present

## 2012-08-08 DIAGNOSIS — M6281 Muscle weakness (generalized): Secondary | ICD-10-CM | POA: Diagnosis not present

## 2012-08-08 DIAGNOSIS — G587 Mononeuritis multiplex: Secondary | ICD-10-CM | POA: Diagnosis not present

## 2012-08-08 DIAGNOSIS — E1142 Type 2 diabetes mellitus with diabetic polyneuropathy: Secondary | ICD-10-CM | POA: Diagnosis not present

## 2012-08-08 DIAGNOSIS — G603 Idiopathic progressive neuropathy: Secondary | ICD-10-CM | POA: Diagnosis not present

## 2012-08-21 DIAGNOSIS — G603 Idiopathic progressive neuropathy: Secondary | ICD-10-CM | POA: Diagnosis not present

## 2012-08-21 DIAGNOSIS — E1142 Type 2 diabetes mellitus with diabetic polyneuropathy: Secondary | ICD-10-CM | POA: Diagnosis not present

## 2012-08-21 DIAGNOSIS — G587 Mononeuritis multiplex: Secondary | ICD-10-CM | POA: Diagnosis not present

## 2012-08-21 DIAGNOSIS — M6281 Muscle weakness (generalized): Secondary | ICD-10-CM | POA: Diagnosis not present

## 2012-08-29 DIAGNOSIS — IMO0002 Reserved for concepts with insufficient information to code with codable children: Secondary | ICD-10-CM | POA: Diagnosis not present

## 2012-08-29 DIAGNOSIS — M545 Low back pain, unspecified: Secondary | ICD-10-CM | POA: Diagnosis not present

## 2012-08-29 DIAGNOSIS — G90529 Complex regional pain syndrome I of unspecified lower limb: Secondary | ICD-10-CM | POA: Diagnosis not present

## 2012-08-29 DIAGNOSIS — G988 Other disorders of nervous system: Secondary | ICD-10-CM | POA: Diagnosis not present

## 2012-09-11 DIAGNOSIS — R0602 Shortness of breath: Secondary | ICD-10-CM | POA: Diagnosis not present

## 2012-09-11 DIAGNOSIS — R079 Chest pain, unspecified: Secondary | ICD-10-CM | POA: Diagnosis not present

## 2012-10-10 DIAGNOSIS — M545 Low back pain, unspecified: Secondary | ICD-10-CM | POA: Diagnosis not present

## 2012-10-10 DIAGNOSIS — E1149 Type 2 diabetes mellitus with other diabetic neurological complication: Secondary | ICD-10-CM | POA: Diagnosis not present

## 2012-10-10 DIAGNOSIS — G988 Other disorders of nervous system: Secondary | ICD-10-CM | POA: Diagnosis not present

## 2012-11-27 DIAGNOSIS — G90529 Complex regional pain syndrome I of unspecified lower limb: Secondary | ICD-10-CM | POA: Diagnosis not present

## 2012-11-27 DIAGNOSIS — M545 Low back pain, unspecified: Secondary | ICD-10-CM | POA: Diagnosis not present

## 2012-11-27 DIAGNOSIS — IMO0002 Reserved for concepts with insufficient information to code with codable children: Secondary | ICD-10-CM | POA: Diagnosis not present

## 2012-11-27 DIAGNOSIS — G988 Other disorders of nervous system: Secondary | ICD-10-CM | POA: Diagnosis not present

## 2013-01-02 DIAGNOSIS — IMO0001 Reserved for inherently not codable concepts without codable children: Secondary | ICD-10-CM | POA: Diagnosis not present

## 2013-01-02 DIAGNOSIS — G988 Other disorders of nervous system: Secondary | ICD-10-CM | POA: Diagnosis not present

## 2013-01-02 DIAGNOSIS — E1149 Type 2 diabetes mellitus with other diabetic neurological complication: Secondary | ICD-10-CM | POA: Diagnosis not present

## 2013-01-02 DIAGNOSIS — IMO0002 Reserved for concepts with insufficient information to code with codable children: Secondary | ICD-10-CM | POA: Diagnosis not present

## 2013-02-05 DIAGNOSIS — M79609 Pain in unspecified limb: Secondary | ICD-10-CM | POA: Diagnosis not present

## 2013-02-05 DIAGNOSIS — G90529 Complex regional pain syndrome I of unspecified lower limb: Secondary | ICD-10-CM | POA: Diagnosis not present

## 2013-02-05 DIAGNOSIS — M25569 Pain in unspecified knee: Secondary | ICD-10-CM | POA: Diagnosis not present

## 2013-02-05 DIAGNOSIS — E1149 Type 2 diabetes mellitus with other diabetic neurological complication: Secondary | ICD-10-CM | POA: Diagnosis not present

## 2013-03-13 DIAGNOSIS — E1142 Type 2 diabetes mellitus with diabetic polyneuropathy: Secondary | ICD-10-CM | POA: Diagnosis not present

## 2013-03-13 DIAGNOSIS — E1049 Type 1 diabetes mellitus with other diabetic neurological complication: Secondary | ICD-10-CM | POA: Diagnosis not present

## 2013-03-13 DIAGNOSIS — G988 Other disorders of nervous system: Secondary | ICD-10-CM | POA: Diagnosis not present

## 2013-03-13 DIAGNOSIS — G90529 Complex regional pain syndrome I of unspecified lower limb: Secondary | ICD-10-CM | POA: Diagnosis not present

## 2013-07-03 DIAGNOSIS — E1149 Type 2 diabetes mellitus with other diabetic neurological complication: Secondary | ICD-10-CM | POA: Diagnosis not present

## 2013-07-03 DIAGNOSIS — IMO0002 Reserved for concepts with insufficient information to code with codable children: Secondary | ICD-10-CM | POA: Diagnosis not present

## 2013-07-03 DIAGNOSIS — E1142 Type 2 diabetes mellitus with diabetic polyneuropathy: Secondary | ICD-10-CM | POA: Diagnosis not present

## 2013-07-03 DIAGNOSIS — G894 Chronic pain syndrome: Secondary | ICD-10-CM | POA: Diagnosis not present

## 2013-07-18 ENCOUNTER — Encounter: Payer: Self-pay | Admitting: Cardiovascular Disease

## 2013-07-18 DIAGNOSIS — I2510 Atherosclerotic heart disease of native coronary artery without angina pectoris: Secondary | ICD-10-CM | POA: Diagnosis not present

## 2013-07-18 DIAGNOSIS — I10 Essential (primary) hypertension: Secondary | ICD-10-CM | POA: Diagnosis not present

## 2013-07-18 DIAGNOSIS — E1149 Type 2 diabetes mellitus with other diabetic neurological complication: Secondary | ICD-10-CM | POA: Diagnosis not present

## 2013-07-18 DIAGNOSIS — E782 Mixed hyperlipidemia: Secondary | ICD-10-CM | POA: Diagnosis not present

## 2013-07-18 DIAGNOSIS — J209 Acute bronchitis, unspecified: Secondary | ICD-10-CM | POA: Diagnosis not present

## 2013-07-31 ENCOUNTER — Other Ambulatory Visit: Payer: Self-pay

## 2013-07-31 ENCOUNTER — Encounter: Payer: Self-pay | Admitting: Cardiovascular Disease

## 2013-07-31 ENCOUNTER — Ambulatory Visit (INDEPENDENT_AMBULATORY_CARE_PROVIDER_SITE_OTHER): Payer: Medicare Other | Admitting: Cardiovascular Disease

## 2013-07-31 VITALS — BP 138/70 | HR 65 | Ht <= 58 in | Wt 167.1 lb

## 2013-07-31 DIAGNOSIS — I2510 Atherosclerotic heart disease of native coronary artery without angina pectoris: Secondary | ICD-10-CM | POA: Diagnosis not present

## 2013-07-31 DIAGNOSIS — I10 Essential (primary) hypertension: Secondary | ICD-10-CM | POA: Diagnosis not present

## 2013-07-31 DIAGNOSIS — E785 Hyperlipidemia, unspecified: Secondary | ICD-10-CM | POA: Diagnosis not present

## 2013-07-31 DIAGNOSIS — Z720 Tobacco use: Secondary | ICD-10-CM

## 2013-07-31 DIAGNOSIS — F17200 Nicotine dependence, unspecified, uncomplicated: Secondary | ICD-10-CM | POA: Diagnosis not present

## 2013-07-31 MED ORDER — NITROGLYCERIN 0.4 MG SL SUBL: 0.4000 mg | SUBLINGUAL_TABLET | SUBLINGUAL | Status: DC | PRN

## 2013-07-31 NOTE — Assessment & Plan Note (Signed)
She seems to be stable from a cardiac standpoint with no change in her symptoms. I recommend continuing medical therapy. I will consider repeating a stress test within the next 6-12 months.

## 2013-07-31 NOTE — Assessment & Plan Note (Signed)
Recent lipid profile was mildly elevated. However, she was not taking atorvastatin which was resumed and should be continued due to her known history of coronary artery disease.

## 2013-07-31 NOTE — Assessment & Plan Note (Signed)
Blood pressure is reasonably controlled on current medications. 

## 2013-07-31 NOTE — Progress Notes (Signed)
Primary care physician: Dr. Gus Height at [facility] in [place]  HPI  This is a 47 year old female who is here today for a followup visit and to reestablish cardiovascular care. She has not been seen since 2012. She has known history of coronary artery disease status post inferior myocardial infarction in July 2010. At that time she had an angioplasty and bare-metal stent placement to the right coronary artery.  Most recent stress test was in 2012 which showed no evidence of ischemia (I cannot locate that report).  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 This has caused anxiety and depression over the years. She had significant depression in the last few years and quit most of her medications. She is feeling better and decided to reestablish with medical care. She has chronic symptoms of chest discomfort and dyspnea with over exertion which has not worsened recently. She currently walks with a cane. Unfortunately, she continues to smoke. She had blood work done recently which was reviewed by me. Her cholesterol was elevated but she was not consistent in taking atorvastatin. She is back on all of her medications for hypertension, hyperlipidemia and diabetes.  No Known Allergies   Current Outpatient Prescriptions on File Prior to Visit  Medication Sig Dispense Refill  . aspirin 325 MG tablet Take 325 mg by mouth daily.        Marland Kitchen atorvastatin (LIPITOR) 80 MG tablet Take 1 tablet (80 mg total) by mouth every evening.  30 tablet  6  . folic acid (FOLVITE) 400 MCG tablet Take 1,000 mcg by mouth daily.       . furosemide (LASIX) 20 MG tablet Take 20 mg by mouth daily.        . insulin detemir (LEVEMIR) 100 UNIT/ML injection Inject 80 Units into the skin 2 (two) times daily.       . insulin regular (HUMULIN R,NOVOLIN R) 100 units/mL injection 20-40 Units. Twice daily & as needed      . metoprolol succinate (TOPROL-XL) 25 MG 24 hr tablet TAKE 1 TABLET ONCE  DAILY.  30 tablet  6  . pantoprazole (PROTONIX) 40 MG tablet Take 40 mg by mouth daily.        Marland Kitchen ZESTRIL 10 MG tablet TAKE 1 TABLET ONCE DAILY.  30 each  6   No current facility-administered medications on file prior to visit.     Past Medical History  Diagnosis Date  . MI (myocardial infarction)   . DM2 (diabetes mellitus, type 2)   . Sympathetic reflex dystrophy   . CAD (coronary artery disease)     s/p MI & stenting  . HLD (hyperlipidemia)   . HTN (hypertension)   . Tobacco abuse      Past Surgical History  Procedure Laterality Date  . Cholecystectomy  2006  . Carpal tunnel release  2002    left  . Dilation and curettage of uterus  1992 & 2004  . Tonsillectomy  1971  . Tubal ligation  2005  . Coronary stent placement  2010  . Cardiac catheterization  06/05/2009    Inferior MI. Severe 1 vessel CAD (RCA)  . Coronary angioplasty with stent placement  06/05/2009    BMS (4.0 X 23 mm) Vision stent to RCA     Family History  Problem Relation Age of Onset  . Huntington's disease       History   Social History  . Marital Status: Married    Spouse Name: N/A  Number of Children: 2  . Years of Education: N/A   Occupational History  . disabled    Social History Main Topics  . Smoking status: Current Every Day Smoker -- 0.80 packs/day for 25 years  . Smokeless tobacco: Not on file  . Alcohol Use: No  . Drug Use: No  . Sexual Activity: Not on file   Other Topics Concern  . Not on file   Social History Narrative  . No narrative on file       PHYSICAL EXAM   BP 138/70  Pulse 65  Ht 4\' 10"  (1.473 m)  Wt 167 lb 1.9 oz (75.805 kg)  BMI 34.94 kg/m2  Constitutional: She is oriented to person, place, and time. She appears well-developed and well-nourished. No distress.  HENT: No nasal discharge.  Head: Normocephalic and atraumatic.  Eyes: Pupils are equal, round, and reactive to light. Right eye exhibits no discharge. Left eye exhibits no discharge.    Neck: Normal range of motion. Neck supple. No JVD present. No thyromegaly present.  Cardiovascular: Normal rate, regular rhythm, normal heart sounds and intact distal pulses. Exam reveals no gallop and no friction rub.  No murmur heard.  Pulmonary/Chest: Effort normal and breath sounds normal. No stridor. No respiratory distress. She has no wheezes. She has no rales. She exhibits no tenderness.  Abdominal: Soft. Bowel sounds are normal. She exhibits no distension. There is no tenderness. There is no rebound and no guarding.  Musculoskeletal: Normal range of motion. She exhibits no edema and no tenderness.  Neurological: She is alert and oriented to person, place, and time. Coordination normal.  Skin: Skin is warm and dry. No rash noted. She is not diaphoretic. No erythema. No pallor.  Psychiatric: She has a normal mood and affect. Her behavior is normal. Judgment and thought content normal.     EKG: Normal sinus rhythm with no significant ST or T wave changes.   ASSESSMENT AND PLAN

## 2013-07-31 NOTE — Patient Instructions (Addendum)
Your physician wants you to follow-up in: 6 months. You will receive a reminder letter in the mail two months in advance. If you don't receive a letter, please call our office to schedule the follow-up appointment.   Continue same medications.  

## 2013-07-31 NOTE — Assessment & Plan Note (Signed)
I again discussed with him the importance of smoking cessation. She will consider stopping once anxiety is better controlled.

## 2013-08-07 ENCOUNTER — Telehealth: Payer: Self-pay | Admitting: Cardiovascular Disease

## 2013-08-07 DIAGNOSIS — R079 Chest pain, unspecified: Secondary | ICD-10-CM

## 2013-08-07 DIAGNOSIS — R072 Precordial pain: Secondary | ICD-10-CM | POA: Diagnosis not present

## 2013-08-07 NOTE — Telephone Encounter (Signed)
New problem   Brandy/[facility] is needing to know if Dr Kirke Corin need to see the pt again or can a stress test be scheduled. Please call Brandy. Pt was in there office today for chest pain.

## 2013-08-07 NOTE — Telephone Encounter (Signed)
Schedule Lexiscan Myoview ASAP in [facility] office.

## 2013-08-07 NOTE — Telephone Encounter (Signed)
Returned call to [place] at [facility] Dr.Arida advised have a [employer] in our [place] office as soon as possible.Patient was called and told Dr.Arida advised needs Lexiscan ASAP.Schedulers will call back to schedule.

## 2013-08-07 NOTE — Telephone Encounter (Signed)
Returned call to [place] at [facility] she stated patient was in their office this morning with chest pain.Stated she wanted to know if Dr.Arida needs to see patient again or does he want to order a lexiscan.Message sent to Dr.Arida for advice.

## 2013-08-21 ENCOUNTER — Ambulatory Visit (HOSPITAL_COMMUNITY): Payer: Medicare Other | Attending: Dermatopathology | Admitting: Radiology

## 2013-08-21 VITALS — BP 129/59 | Ht <= 58 in | Wt 162.0 lb

## 2013-08-21 DIAGNOSIS — Z794 Long term (current) use of insulin: Secondary | ICD-10-CM | POA: Insufficient documentation

## 2013-08-21 DIAGNOSIS — Z6833 Body mass index (BMI) 33.0-33.9, adult: Secondary | ICD-10-CM | POA: Insufficient documentation

## 2013-08-21 DIAGNOSIS — R0609 Other forms of dyspnea: Secondary | ICD-10-CM | POA: Insufficient documentation

## 2013-08-21 DIAGNOSIS — E119 Type 2 diabetes mellitus without complications: Secondary | ICD-10-CM | POA: Diagnosis not present

## 2013-08-21 DIAGNOSIS — R0602 Shortness of breath: Secondary | ICD-10-CM | POA: Diagnosis not present

## 2013-08-21 DIAGNOSIS — E669 Obesity, unspecified: Secondary | ICD-10-CM | POA: Diagnosis not present

## 2013-08-21 DIAGNOSIS — R000 Tachycardia, unspecified: Secondary | ICD-10-CM | POA: Diagnosis not present

## 2013-08-21 DIAGNOSIS — R110 Nausea: Secondary | ICD-10-CM | POA: Diagnosis not present

## 2013-08-21 DIAGNOSIS — I10 Essential (primary) hypertension: Secondary | ICD-10-CM | POA: Diagnosis not present

## 2013-08-21 DIAGNOSIS — I2510 Atherosclerotic heart disease of native coronary artery without angina pectoris: Secondary | ICD-10-CM

## 2013-08-21 DIAGNOSIS — R079 Chest pain, unspecified: Secondary | ICD-10-CM | POA: Insufficient documentation

## 2013-08-21 DIAGNOSIS — F17200 Nicotine dependence, unspecified, uncomplicated: Secondary | ICD-10-CM | POA: Insufficient documentation

## 2013-08-21 DIAGNOSIS — R0989 Other specified symptoms and signs involving the circulatory and respiratory systems: Secondary | ICD-10-CM | POA: Insufficient documentation

## 2013-08-21 DIAGNOSIS — R002 Palpitations: Secondary | ICD-10-CM | POA: Insufficient documentation

## 2013-08-21 MED ORDER — TECHNETIUM TC 99M SESTAMIBI GENERIC - CARDIOLITE
30.0000 | Freq: Once | INTRAVENOUS | Status: AC | PRN
  Administered 2013-08-21: 30 via INTRAVENOUS

## 2013-08-21 MED ORDER — AMINOPHYLLINE 25 MG/ML IV SOLN
75.0000 mg | Freq: Once | INTRAVENOUS | Status: AC
  Administered 2013-08-21: 75 mg via INTRAVENOUS

## 2013-08-21 MED ORDER — TECHNETIUM TC 99M SESTAMIBI GENERIC - CARDIOLITE
10.0000 | Freq: Once | INTRAVENOUS | Status: AC | PRN
  Administered 2013-08-21: 10 via INTRAVENOUS

## 2013-08-21 MED ORDER — REGADENOSON 0.4 MG/5ML IV SOLN
0.4000 mg | Freq: Once | INTRAVENOUS | Status: AC
  Administered 2013-08-21: 0.4 mg via INTRAVENOUS

## 2013-08-21 NOTE — Progress Notes (Signed)
[facility] SITE 3 NUCLEAR MED [address] [place], [place] [address] [phone]    Cardiology Nuclear Med Study  [name] is a 47 y.o. female     MRN : [id number]     DOB: [birth year]  Procedure Date: 08/21/2013  Nuclear Med Background Indication for Stress Test:  Evaluation for Ischemia and Stent Patency History:  2012 MPS - Normal EF 66%, 2010 MI, Stent RCA, PTCA Cardiac Risk Factors: Hypertension, IDDM Type 2, Lipids, Obesity and Smoker  Symptoms:  Chest Pain, Diaphoresis, DOE, Nausea, Palpitations and Rapid HR   Nuclear Pre-Procedure Caffeine/Decaff Intake:  None NPO After: 10:00pm   Lungs:  clear O2 Sat: 97% on room air. IV 0.9% NS with Angio Cath:  22g  IV Site: R Antecubital  IV Started by:  Bonnita Levan, RN  Chest Size (in):  38 Cup Size: C  Height: 4\' 10"  (1.473 m)  Weight:  162 lb (73.483 kg)  BMI:  Body mass index is 33.87 kg/(m^2). Tech Comments:  CBG@ 8 am = 202, Patient held Insulin this AM    Nuclear Med Study 1 or 2 day study: 1 day  Stress Test Type:  Lexiscan  Reading MD: Cassell Clement, MD  Order Authorizing Provider:  Lorine Bears, MD  Resting Radionuclide: Technetium 41m Sestamibi  Resting Radionuclide Dose: 11.0 mCi   Stress Radionuclide:  Technetium 59m Sestamibi  Stress Radionuclide Dose: 33.0 mCi           Stress Protocol Rest HR: 61 Stress HR: 112  Rest BP: 129/59 Stress BP: 153/49  Exercise Time (min): n/a METS: n/a   Predicted Max HR: 171 bpm % Max HR: 65.5 bpm Rate Pressure Product: 02725   Dose of Adenosine (mg):  n/a Dose of Lexiscan: 0.4 mg  Dose of Atropine (mg): n/a Dose of Dobutamine: n/a mcg/kg/min (at max HR)  Stress Test Technologist: Bonnita Levan, RN  Nuclear Technologist:  Doyne Keel, CNMT     Rest Procedure:  Myocardial perfusion imaging was performed at rest 45 minutes following the intravenous administration of Technetium 67m Sestamibi. Rest ECG: NSR with non-specific ST-T wave changes  Stress  Procedure:  The patient received IV Lexiscan 0.4 mg over 15-seconds.  Technetium 42m Sestamibi injected at 30-seconds.  Quantitative spect images were obtained after a 45 minute delay. Stress ECG: No significant change from baseline ECG  QPS Raw Data Images:  Normal; no motion artifact; normal heart/lung ratio. Stress Images:  Normal homogeneous uptake in all areas of the myocardium. Rest Images:  Normal homogeneous uptake in all areas of the myocardium. Subtraction (SDS):  No evidence of ischemia. Transient Ischemic Dilatation (Normal <1.22):  n/a Lung/Heart Ratio (Normal <0.45):  0.47  Quantitative Gated Spect Images QGS EDV:  78 ml QGS ESV:  17 ml  Impression Exercise Capacity:  Lexiscan with no exercise. BP Response:  Normal blood pressure response. Clinical Symptoms:  No chest pain. ECG Impression:  No significant ST segment change suggestive of ischemia. Comparison with Prior Nuclear Study: No images to compare  Overall Impression:  Normal stress nuclear study.  LV Ejection Fraction: 79%.  LV Wall Motion:  NL LV Function; NL Wall Motion   [employer]

## 2013-08-29 DIAGNOSIS — R1011 Right upper quadrant pain: Secondary | ICD-10-CM | POA: Diagnosis not present

## 2013-08-29 DIAGNOSIS — Z23 Encounter for immunization: Secondary | ICD-10-CM | POA: Diagnosis not present

## 2013-08-31 DIAGNOSIS — R1011 Right upper quadrant pain: Secondary | ICD-10-CM | POA: Diagnosis not present

## 2013-09-05 DIAGNOSIS — E1149 Type 2 diabetes mellitus with other diabetic neurological complication: Secondary | ICD-10-CM | POA: Diagnosis not present

## 2013-09-05 DIAGNOSIS — E1142 Type 2 diabetes mellitus with diabetic polyneuropathy: Secondary | ICD-10-CM | POA: Diagnosis not present

## 2013-09-05 DIAGNOSIS — G894 Chronic pain syndrome: Secondary | ICD-10-CM | POA: Diagnosis not present

## 2013-09-05 DIAGNOSIS — IMO0002 Reserved for concepts with insufficient information to code with codable children: Secondary | ICD-10-CM | POA: Diagnosis not present

## 2013-10-01 DIAGNOSIS — R1011 Right upper quadrant pain: Secondary | ICD-10-CM | POA: Diagnosis not present

## 2013-10-31 DIAGNOSIS — G894 Chronic pain syndrome: Secondary | ICD-10-CM | POA: Diagnosis not present

## 2013-10-31 DIAGNOSIS — E1142 Type 2 diabetes mellitus with diabetic polyneuropathy: Secondary | ICD-10-CM | POA: Diagnosis not present

## 2013-10-31 DIAGNOSIS — E1149 Type 2 diabetes mellitus with other diabetic neurological complication: Secondary | ICD-10-CM | POA: Diagnosis not present

## 2013-10-31 DIAGNOSIS — IMO0002 Reserved for concepts with insufficient information to code with codable children: Secondary | ICD-10-CM | POA: Diagnosis not present

## 2013-12-20 DIAGNOSIS — G894 Chronic pain syndrome: Secondary | ICD-10-CM | POA: Diagnosis not present

## 2013-12-20 DIAGNOSIS — IMO0002 Reserved for concepts with insufficient information to code with codable children: Secondary | ICD-10-CM | POA: Diagnosis not present

## 2013-12-20 DIAGNOSIS — E1142 Type 2 diabetes mellitus with diabetic polyneuropathy: Secondary | ICD-10-CM | POA: Diagnosis not present

## 2013-12-20 DIAGNOSIS — E1149 Type 2 diabetes mellitus with other diabetic neurological complication: Secondary | ICD-10-CM | POA: Diagnosis not present

## 2014-01-03 DIAGNOSIS — Z4681 Encounter for fitting and adjustment of insulin pump: Secondary | ICD-10-CM | POA: Diagnosis not present

## 2014-01-03 DIAGNOSIS — K219 Gastro-esophageal reflux disease without esophagitis: Secondary | ICD-10-CM | POA: Diagnosis not present

## 2014-01-03 DIAGNOSIS — E782 Mixed hyperlipidemia: Secondary | ICD-10-CM | POA: Diagnosis not present

## 2014-01-03 DIAGNOSIS — I10 Essential (primary) hypertension: Secondary | ICD-10-CM | POA: Diagnosis not present

## 2014-01-03 DIAGNOSIS — E1149 Type 2 diabetes mellitus with other diabetic neurological complication: Secondary | ICD-10-CM | POA: Diagnosis not present

## 2014-01-29 ENCOUNTER — Ambulatory Visit: Payer: BC Managed Care – PPO | Admitting: Cardiovascular Disease

## 2014-02-05 ENCOUNTER — Ambulatory Visit (INDEPENDENT_AMBULATORY_CARE_PROVIDER_SITE_OTHER): Payer: Medicare Other | Admitting: Cardiovascular Disease

## 2014-02-05 ENCOUNTER — Encounter: Payer: Self-pay | Admitting: Cardiovascular Disease

## 2014-02-05 VITALS — BP 112/72 | HR 67 | Ht <= 58 in | Wt 168.0 lb

## 2014-02-05 DIAGNOSIS — I2510 Atherosclerotic heart disease of native coronary artery without angina pectoris: Secondary | ICD-10-CM | POA: Diagnosis not present

## 2014-02-05 DIAGNOSIS — M79609 Pain in unspecified limb: Secondary | ICD-10-CM

## 2014-02-05 DIAGNOSIS — I10 Essential (primary) hypertension: Secondary | ICD-10-CM | POA: Diagnosis not present

## 2014-02-05 DIAGNOSIS — F17200 Nicotine dependence, unspecified, uncomplicated: Secondary | ICD-10-CM

## 2014-02-05 DIAGNOSIS — I739 Peripheral vascular disease, unspecified: Secondary | ICD-10-CM | POA: Diagnosis not present

## 2014-02-05 DIAGNOSIS — E785 Hyperlipidemia, unspecified: Secondary | ICD-10-CM | POA: Diagnosis not present

## 2014-02-05 DIAGNOSIS — Z720 Tobacco use: Secondary | ICD-10-CM

## 2014-02-05 DIAGNOSIS — M79605 Pain in left leg: Secondary | ICD-10-CM

## 2014-02-05 NOTE — Progress Notes (Signed)
Primary care physician: Dr. Gus [facility]Andrea [name] at [facility] [place]  HPI  This is a 48 year old female who is here today for a followup visit regarding coronary artery disease. She is status post inferior myocardial infarction in July 2010. At that time she had an angioplasty and bare-metal stent placement to the right coronary artery.  Most recent stress test was in August of 2014  showed no evidence of ischemia with normal ejection fraction.  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 This has caused anxiety and depression over the years. She currently walks with a cane. Unfortunately, she continues to smoke. She denies any chest discomfort at this time. Exertional dyspnea is stable. She gets occasional heartburn. She is currently on insulin pump and diabetes control has improved significantly. She reports improved hyperlipidemia as well. She reports inability to quit smoking at this time. She has significant left leg pain which has been attributed to RSD.    No Known Allergies   Current Outpatient Prescriptions on File Prior to Visit  Medication Sig Dispense Refill  . aspirin 325 MG tablet Take 325 mg by mouth daily.        Marland Kitchen. atorvastatin (LIPITOR) 80 MG tablet Take 1 tablet (80 mg total) by mouth every evening.  30 tablet  6  . fentaNYL (DURAGESIC - DOSED MCG/HR) 100 MCG/HR Place 1 patch onto the skin every 3 (three) days.      . folic acid (FOLVITE) 400 MCG tablet Take 1,000 mcg by mouth daily.       . furosemide (LASIX) 20 MG tablet Take 20 mg by mouth daily as needed.       . metoprolol succinate (TOPROL-XL) 25 MG 24 hr tablet TAKE 1 TABLET ONCE DAILY.  30 tablet  6  . nitroGLYCERIN (NITROSTAT) 0.4 MG SL tablet Place 1 tablet (0.4 mg total) under the tongue every 5 (five) minutes as needed.  25 tablet  1  . pantoprazole (PROTONIX) 40 MG tablet Take 40 mg by mouth daily.        Marland Kitchen. ZESTRIL 10 MG tablet TAKE 1 TABLET ONCE DAILY.  30 each  6   No  current facility-administered medications on file prior to visit.     Past Medical History  Diagnosis Date  . MI (myocardial infarction)   . DM2 (diabetes mellitus, type 2)   . Sympathetic reflex dystrophy   . CAD (coronary artery disease)     s/p MI & stenting  . HLD (hyperlipidemia)   . HTN (hypertension)   . Tobacco abuse      Past Surgical History  Procedure Laterality Date  . Cholecystectomy  2006  . Carpal tunnel release  2002    left  . Dilation and curettage of uterus  1992 & 2004  . Tonsillectomy  1971  . Tubal ligation  2005  . Coronary stent placement  2010  . Cardiac catheterization  06/05/2009    Inferior MI. Severe 1 vessel CAD (RCA)  . Coronary angioplasty with stent placement  06/05/2009    BMS (4.0 X 23 mm) Vision stent to RCA     Family History  Problem Relation Age of Onset  . Huntington's disease       History   Social History  . Marital Status: Married    Spouse Name: N/A    Number of Children: 2  . Years of Education: N/A   Occupational History  . disabled    Social History Main  Topics  . Smoking status: Current Every Day Smoker -- 0.80 packs/day for 25 years  . Smokeless tobacco: Not on file  . Alcohol Use: No  . Drug Use: No  . Sexual Activity: Not on file   Other Topics Concern  . Not on file   Social History Narrative  . No narrative on file       PHYSICAL EXAM   BP 112/72  Pulse 67  Ht 4\' 10"  (1.473 m)  Wt 168 lb (76.204 kg)  BMI 35.12 kg/m2  Constitutional: She is oriented to person, place, and time. She appears well-developed and well-nourished. No distress.  HENT: No nasal discharge.  Head: Normocephalic and atraumatic.  Eyes: Pupils are equal, round, and reactive to light. Right eye exhibits no discharge. Left eye exhibits no discharge.  Neck: Normal range of motion. Neck supple. No JVD present. No thyromegaly present.  Cardiovascular: Normal rate, regular rhythm, normal heart sounds and intact distal  pulses. Exam reveals no gallop and no friction rub.  No murmur heard.  Pulmonary/Chest: Effort normal and breath sounds normal. No stridor. No respiratory distress. She has no wheezes. She has no rales. She exhibits no tenderness.  Abdominal: Soft. Bowel sounds are normal. She exhibits no distension. There is no tenderness. There is no rebound and no guarding.  Musculoskeletal: Normal range of motion. She exhibits no edema and no tenderness.  Neurological: She is alert and oriented to person, place, and time. Coordination normal.  Skin: Skin is warm and dry. No rash noted. She is not diaphoretic. No erythema. No pallor.  Psychiatric: She has a normal mood and affect. Her behavior is normal. Judgment and thought content normal.  Distal pulses are not palpable. The left leg is cold compared to the right leg.     ASSESSMENT AND PLAN

## 2014-02-05 NOTE — Patient Instructions (Signed)
Your physician has requested that you have an ankle brachial index (ABI). During this test an ultrasound and blood pressure cuff are used to evaluate the arteries that supply the arms and legs with blood. Allow thirty minutes for this exam. There are no restrictions or special instructions.  Your physician has requested that you have a lower extremity arterial doppler. During this test an ultrasound is used to evaluate arterial blood flow in the legs. Allow one hour for this exam. There are no restrictions or special instructions.  Your physician wants you to follow-up in: 1 year with Dr. Kirke CorinArida. You will receive a reminder letter in the mail two months in advance. If you don't receive a letter, please call our office to schedule the follow-up appointment.

## 2014-02-07 ENCOUNTER — Encounter: Payer: Self-pay | Admitting: Cardiovascular Disease

## 2014-02-07 DIAGNOSIS — M79605 Pain in left leg: Secondary | ICD-10-CM | POA: Insufficient documentation

## 2014-02-07 NOTE — Assessment & Plan Note (Signed)
She is doing well from a cardiac standpoint with no symptoms suggestive of angina. Continue medical therapy. Most recent stress test in August of 2014 was normal.

## 2014-02-07 NOTE — Assessment & Plan Note (Signed)
This could be due to known history of RSD. However, she has diminished pulses and prolonged history of tobacco use. Thus, I requested an ABI and lower extremity Doppler.

## 2014-02-07 NOTE — Assessment & Plan Note (Signed)
Continue treatment with atorvastatin with a target LDL of less than 70. 

## 2014-02-07 NOTE — Assessment & Plan Note (Signed)
She is not able to quit smoking at this time.

## 2014-02-07 NOTE — Assessment & Plan Note (Signed)
Blood pressure is well controlled on current medications. 

## 2014-02-14 DIAGNOSIS — G894 Chronic pain syndrome: Secondary | ICD-10-CM | POA: Diagnosis not present

## 2014-02-14 DIAGNOSIS — E1142 Type 2 diabetes mellitus with diabetic polyneuropathy: Secondary | ICD-10-CM | POA: Diagnosis not present

## 2014-02-14 DIAGNOSIS — IMO0002 Reserved for concepts with insufficient information to code with codable children: Secondary | ICD-10-CM | POA: Diagnosis not present

## 2014-02-14 DIAGNOSIS — E1149 Type 2 diabetes mellitus with other diabetic neurological complication: Secondary | ICD-10-CM | POA: Diagnosis not present

## 2014-02-20 ENCOUNTER — Encounter (HOSPITAL_COMMUNITY): Payer: BC Managed Care – PPO

## 2014-03-06 ENCOUNTER — Ambulatory Visit (HOSPITAL_COMMUNITY): Payer: BC Managed Care – PPO | Attending: Cardiology | Admitting: Cardiology

## 2014-03-06 DIAGNOSIS — R0989 Other specified symptoms and signs involving the circulatory and respiratory systems: Secondary | ICD-10-CM | POA: Diagnosis not present

## 2014-03-06 DIAGNOSIS — R209 Unspecified disturbances of skin sensation: Secondary | ICD-10-CM | POA: Insufficient documentation

## 2014-03-06 DIAGNOSIS — I739 Peripheral vascular disease, unspecified: Secondary | ICD-10-CM

## 2014-03-06 DIAGNOSIS — R200 Anesthesia of skin: Secondary | ICD-10-CM

## 2014-03-06 NOTE — Progress Notes (Signed)
Lower arterial doppler complete 

## 2014-04-18 DIAGNOSIS — G894 Chronic pain syndrome: Secondary | ICD-10-CM | POA: Diagnosis not present

## 2014-04-18 DIAGNOSIS — IMO0002 Reserved for concepts with insufficient information to code with codable children: Secondary | ICD-10-CM | POA: Diagnosis not present

## 2014-04-18 DIAGNOSIS — E1149 Type 2 diabetes mellitus with other diabetic neurological complication: Secondary | ICD-10-CM | POA: Diagnosis not present

## 2014-04-18 DIAGNOSIS — E1142 Type 2 diabetes mellitus with diabetic polyneuropathy: Secondary | ICD-10-CM | POA: Diagnosis not present

## 2014-05-01 DIAGNOSIS — K219 Gastro-esophageal reflux disease without esophagitis: Secondary | ICD-10-CM | POA: Diagnosis not present

## 2014-05-01 DIAGNOSIS — F321 Major depressive disorder, single episode, moderate: Secondary | ICD-10-CM | POA: Diagnosis not present

## 2014-05-01 DIAGNOSIS — I10 Essential (primary) hypertension: Secondary | ICD-10-CM | POA: Diagnosis not present

## 2014-05-01 DIAGNOSIS — E782 Mixed hyperlipidemia: Secondary | ICD-10-CM | POA: Diagnosis not present

## 2014-05-01 DIAGNOSIS — E1149 Type 2 diabetes mellitus with other diabetic neurological complication: Secondary | ICD-10-CM | POA: Diagnosis not present

## 2014-06-13 DIAGNOSIS — G894 Chronic pain syndrome: Secondary | ICD-10-CM | POA: Diagnosis not present

## 2014-06-13 DIAGNOSIS — IMO0002 Reserved for concepts with insufficient information to code with codable children: Secondary | ICD-10-CM | POA: Diagnosis not present

## 2014-06-13 DIAGNOSIS — E1142 Type 2 diabetes mellitus with diabetic polyneuropathy: Secondary | ICD-10-CM | POA: Diagnosis not present

## 2014-06-13 DIAGNOSIS — E1149 Type 2 diabetes mellitus with other diabetic neurological complication: Secondary | ICD-10-CM | POA: Diagnosis not present

## 2014-07-25 DIAGNOSIS — R1012 Left upper quadrant pain: Secondary | ICD-10-CM | POA: Diagnosis not present

## 2014-08-07 DIAGNOSIS — G894 Chronic pain syndrome: Secondary | ICD-10-CM | POA: Diagnosis not present

## 2014-08-07 DIAGNOSIS — IMO0002 Reserved for concepts with insufficient information to code with codable children: Secondary | ICD-10-CM | POA: Diagnosis not present

## 2014-08-07 DIAGNOSIS — E1142 Type 2 diabetes mellitus with diabetic polyneuropathy: Secondary | ICD-10-CM | POA: Diagnosis not present

## 2014-08-07 DIAGNOSIS — E1149 Type 2 diabetes mellitus with other diabetic neurological complication: Secondary | ICD-10-CM | POA: Diagnosis not present

## 2014-08-08 DIAGNOSIS — I2510 Atherosclerotic heart disease of native coronary artery without angina pectoris: Secondary | ICD-10-CM | POA: Diagnosis not present

## 2014-08-08 DIAGNOSIS — F321 Major depressive disorder, single episode, moderate: Secondary | ICD-10-CM | POA: Diagnosis not present

## 2014-08-08 DIAGNOSIS — E782 Mixed hyperlipidemia: Secondary | ICD-10-CM | POA: Diagnosis not present

## 2014-08-08 DIAGNOSIS — I10 Essential (primary) hypertension: Secondary | ICD-10-CM | POA: Diagnosis not present

## 2014-08-08 DIAGNOSIS — K219 Gastro-esophageal reflux disease without esophagitis: Secondary | ICD-10-CM | POA: Diagnosis not present

## 2014-08-08 DIAGNOSIS — E1149 Type 2 diabetes mellitus with other diabetic neurological complication: Secondary | ICD-10-CM | POA: Diagnosis not present

## 2014-10-07 DIAGNOSIS — M5417 Radiculopathy, lumbosacral region: Secondary | ICD-10-CM | POA: Diagnosis not present

## 2014-10-07 DIAGNOSIS — E1142 Type 2 diabetes mellitus with diabetic polyneuropathy: Secondary | ICD-10-CM | POA: Diagnosis not present

## 2014-10-07 DIAGNOSIS — G894 Chronic pain syndrome: Secondary | ICD-10-CM | POA: Diagnosis not present

## 2014-10-07 DIAGNOSIS — M47817 Spondylosis without myelopathy or radiculopathy, lumbosacral region: Secondary | ICD-10-CM | POA: Diagnosis not present

## 2015-02-10 DIAGNOSIS — E1142 Type 2 diabetes mellitus with diabetic polyneuropathy: Secondary | ICD-10-CM | POA: Diagnosis not present

## 2015-02-10 DIAGNOSIS — G894 Chronic pain syndrome: Secondary | ICD-10-CM | POA: Diagnosis not present

## 2015-02-10 DIAGNOSIS — M5417 Radiculopathy, lumbosacral region: Secondary | ICD-10-CM | POA: Diagnosis not present

## 2015-02-10 DIAGNOSIS — M47817 Spondylosis without myelopathy or radiculopathy, lumbosacral region: Secondary | ICD-10-CM | POA: Diagnosis not present

## 2015-02-11 ENCOUNTER — Encounter: Payer: Self-pay | Admitting: Cardiovascular Disease

## 2015-02-11 ENCOUNTER — Ambulatory Visit (INDEPENDENT_AMBULATORY_CARE_PROVIDER_SITE_OTHER): Payer: BLUE CROSS/BLUE SHIELD | Admitting: Cardiovascular Disease

## 2015-02-11 VITALS — BP 130/60 | HR 65 | Ht <= 58 in | Wt 178.1 lb

## 2015-02-11 DIAGNOSIS — Z720 Tobacco use: Secondary | ICD-10-CM | POA: Diagnosis not present

## 2015-02-11 DIAGNOSIS — E785 Hyperlipidemia, unspecified: Secondary | ICD-10-CM

## 2015-02-11 DIAGNOSIS — I2510 Atherosclerotic heart disease of native coronary artery without angina pectoris: Secondary | ICD-10-CM | POA: Diagnosis not present

## 2015-02-11 DIAGNOSIS — I10 Essential (primary) hypertension: Secondary | ICD-10-CM

## 2015-02-11 NOTE — Patient Instructions (Addendum)
Your physician wants you to follow-up in: 12 months with Dr. Katheren PullerArida You will receive a reminder letter in the mail two months in advance. If you don't receive a letter, please call our office to schedule the follow-up appointment.

## 2015-02-11 NOTE — Progress Notes (Signed)
Primary care physician: Dr. Gus HeightAndrea Johnson at [facility] in [place]  HPI  This is a 49 year old female who is here today for a followup visit regarding coronary artery disease. She is status post inferior myocardial infarction in July 2010. At that time she had an angioplasty and bare-metal stent placement to the right coronary artery.  Most recent stress test was in August of 2014  showed no evidence of ischemia with normal ejection fraction.  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 This has caused anxiety and depression over the years. She currently walks with a cane. Unfortunately, she continues to smoke. She denies any chest discomfort at this time. Exertional dyspnea is stable.  Overall, she is in better spirits than before and reports improvement in diabetes control with insulin pump. She is also trying to be more active. ABI last year was normal.  No Known Allergies   Current Outpatient Prescriptions on File Prior to Visit  Medication Sig Dispense Refill  . aspirin 325 MG tablet Take 325 mg by mouth daily.      Marland Kitchen. atorvastatin (LIPITOR) 80 MG tablet Take 1 tablet (80 mg total) by mouth every evening. 30 tablet 6  . fentaNYL (DURAGESIC - DOSED MCG/HR) 100 MCG/HR Place 1 patch onto the skin every other day.     . folic acid (FOLVITE) 400 MCG tablet Take 1,000 mcg by mouth daily.     . furosemide (LASIX) 20 MG tablet Take 20 mg by mouth daily as needed.     Marland Kitchen. HUMALOG 100 UNIT/ML injection Insulin Pump. Sliding scale    . metoprolol succinate (TOPROL-XL) 25 MG 24 hr tablet TAKE 1 TABLET ONCE DAILY. 30 tablet 6  . nitroGLYCERIN (NITROSTAT) 0.4 MG SL tablet Place 1 tablet (0.4 mg total) under the tongue every 5 (five) minutes as needed. 25 tablet 1  . Oxycodone HCl 10 MG TABS Take 1 tablet by mouth 3 (three) times daily.    . pantoprazole (PROTONIX) 40 MG tablet Take 40 mg by mouth daily.      Marland Kitchen. ZESTRIL 10 MG tablet TAKE 1 TABLET ONCE DAILY. 30  each 6   No current facility-administered medications on file prior to visit.     Past Medical History  Diagnosis Date  . MI (myocardial infarction)   . DM2 (diabetes mellitus, type 2)   . Sympathetic reflex dystrophy   . CAD (coronary artery disease)     s/p MI & stenting  . HLD (hyperlipidemia)   . HTN (hypertension)   . Tobacco abuse      Past Surgical History  Procedure Laterality Date  . Cholecystectomy  2006  . Carpal tunnel release  2002    left  . Dilation and curettage of uterus  1992 & 2004  . Tonsillectomy  1971  . Tubal ligation  2005  . Coronary stent placement  2010  . Cardiac catheterization  06/05/2009    Inferior MI. Severe 1 vessel CAD (RCA)  . Coronary angioplasty with stent placement  06/05/2009    BMS (4.0 X 23 mm) Vision stent to RCA     Family History  Problem Relation Age of Onset  . Huntington's disease    . Huntington's disease Mother   . Hypertension Father   . Hyperlipidemia Father   . Arrhythmia Father      History   Social History  . Marital Status: Married    Spouse Name: N/A  . Number of Children: 2  .  Years of Education: N/A   Occupational History  . disabled    Social History Main Topics  . Smoking status: Current Every Day Smoker -- 0.80 packs/day for 25 years  . Smokeless tobacco: Not on file  . Alcohol Use: No  . Drug Use: No  . Sexual Activity: Not on file   Other Topics Concern  . Not on file   Social History Narrative       PHYSICAL EXAM   BP 130/60 mmHg  Pulse 65  Ht  (1.473 m)  Wt 178 lb 1.9 oz (80.795 kg)  BMI 37.24 kg/m2  Constitutional: She is oriented to person, place, and time. She appears well-developed and well-nourished. No distress.  HENT: No nasal discharge.  Head: Normocephalic and atraumatic.  Eyes: Pupils are equal, round, and reactive to light. Right eye exhibits no discharge. Left eye exhibits no discharge.  Neck: Normal range of motion. Neck supple. No JVD present. No  thyromegaly present.  Cardiovascular: Normal rate, regular rhythm, normal heart sounds and intact distal pulses. Exam reveals no gallop and no friction rub.  No murmur heard.  Pulmonary/Chest: Effort normal and breath sounds normal. No stridor. No respiratory distress. She has no wheezes. She has no rales. She exhibits no tenderness.  Abdominal: Soft. Bowel sounds are normal. She exhibits no distension. There is no tenderness. There is no rebound and no guarding.  Musculoskeletal: Normal range of motion. She exhibits no edema and no tenderness.  Neurological: She is alert and oriented to person, place, and time. Coordination normal.  Skin: Skin is warm and dry. No rash noted. She is not diaphoretic. No erythema. No pallor.  Psychiatric: She has a normal mood and affect. Her behavior is normal. Judgment and thought content normal.     EKG: Normal sinus rhythm with no significant ST or T wave changes.  ASSESSMENT AND PLAN

## 2015-02-17 NOTE — Assessment & Plan Note (Signed)
Blood pressure is reasonably controlled on current medications. 

## 2015-02-17 NOTE — Assessment & Plan Note (Signed)
She is stable overall with no symptoms suggestive of angina. Continue medical therapy.

## 2015-02-17 NOTE — Assessment & Plan Note (Signed)
Continue treatment with atorvastatin with a target LDL of less than 70. 

## 2015-02-17 NOTE — Assessment & Plan Note (Signed)
I again discussed with her the importance of smoking cessation. 

## 2015-02-20 DIAGNOSIS — E782 Mixed hyperlipidemia: Secondary | ICD-10-CM | POA: Diagnosis not present

## 2015-02-20 DIAGNOSIS — Z4681 Encounter for fitting and adjustment of insulin pump: Secondary | ICD-10-CM | POA: Diagnosis not present

## 2015-02-20 DIAGNOSIS — E1140 Type 2 diabetes mellitus with diabetic neuropathy, unspecified: Secondary | ICD-10-CM | POA: Diagnosis not present

## 2015-02-20 DIAGNOSIS — F321 Major depressive disorder, single episode, moderate: Secondary | ICD-10-CM | POA: Diagnosis not present

## 2015-02-20 DIAGNOSIS — I25111 Atherosclerotic heart disease of native coronary artery with angina pectoris with documented spasm: Secondary | ICD-10-CM | POA: Diagnosis not present

## 2015-02-20 DIAGNOSIS — I119 Hypertensive heart disease without heart failure: Secondary | ICD-10-CM | POA: Diagnosis not present

## 2015-02-20 DIAGNOSIS — Z794 Long term (current) use of insulin: Secondary | ICD-10-CM | POA: Diagnosis not present

## 2015-02-20 DIAGNOSIS — K219 Gastro-esophageal reflux disease without esophagitis: Secondary | ICD-10-CM | POA: Diagnosis not present

## 2015-03-12 DIAGNOSIS — S39012D Strain of muscle, fascia and tendon of lower back, subsequent encounter: Secondary | ICD-10-CM | POA: Diagnosis not present

## 2015-03-14 DIAGNOSIS — S39012D Strain of muscle, fascia and tendon of lower back, subsequent encounter: Secondary | ICD-10-CM | POA: Diagnosis not present

## 2015-03-18 DIAGNOSIS — S39012D Strain of muscle, fascia and tendon of lower back, subsequent encounter: Secondary | ICD-10-CM | POA: Diagnosis not present

## 2015-03-24 DIAGNOSIS — S39012D Strain of muscle, fascia and tendon of lower back, subsequent encounter: Secondary | ICD-10-CM | POA: Diagnosis not present

## 2015-03-31 DIAGNOSIS — M5417 Radiculopathy, lumbosacral region: Secondary | ICD-10-CM | POA: Diagnosis not present

## 2015-03-31 DIAGNOSIS — E1142 Type 2 diabetes mellitus with diabetic polyneuropathy: Secondary | ICD-10-CM | POA: Diagnosis not present

## 2015-03-31 DIAGNOSIS — G894 Chronic pain syndrome: Secondary | ICD-10-CM | POA: Diagnosis not present

## 2015-03-31 DIAGNOSIS — M47817 Spondylosis without myelopathy or radiculopathy, lumbosacral region: Secondary | ICD-10-CM | POA: Diagnosis not present

## 2015-05-06 ENCOUNTER — Encounter: Payer: Self-pay | Admitting: Cardiovascular Disease

## 2015-05-26 DIAGNOSIS — K219 Gastro-esophageal reflux disease without esophagitis: Secondary | ICD-10-CM | POA: Diagnosis not present

## 2015-05-26 DIAGNOSIS — E1140 Type 2 diabetes mellitus with diabetic neuropathy, unspecified: Secondary | ICD-10-CM | POA: Diagnosis not present

## 2015-05-26 DIAGNOSIS — E782 Mixed hyperlipidemia: Secondary | ICD-10-CM | POA: Diagnosis not present

## 2015-05-26 DIAGNOSIS — Z4681 Encounter for fitting and adjustment of insulin pump: Secondary | ICD-10-CM | POA: Diagnosis not present

## 2015-05-26 DIAGNOSIS — Z794 Long term (current) use of insulin: Secondary | ICD-10-CM | POA: Diagnosis not present

## 2015-05-26 DIAGNOSIS — F321 Major depressive disorder, single episode, moderate: Secondary | ICD-10-CM | POA: Diagnosis not present

## 2015-05-26 DIAGNOSIS — I25111 Atherosclerotic heart disease of native coronary artery with angina pectoris with documented spasm: Secondary | ICD-10-CM | POA: Diagnosis not present

## 2015-05-26 DIAGNOSIS — I119 Hypertensive heart disease without heart failure: Secondary | ICD-10-CM | POA: Diagnosis not present

## 2015-06-17 DIAGNOSIS — G894 Chronic pain syndrome: Secondary | ICD-10-CM | POA: Diagnosis not present

## 2015-06-17 DIAGNOSIS — M47817 Spondylosis without myelopathy or radiculopathy, lumbosacral region: Secondary | ICD-10-CM | POA: Diagnosis not present

## 2015-06-17 DIAGNOSIS — M5417 Radiculopathy, lumbosacral region: Secondary | ICD-10-CM | POA: Diagnosis not present

## 2015-06-17 DIAGNOSIS — E1142 Type 2 diabetes mellitus with diabetic polyneuropathy: Secondary | ICD-10-CM | POA: Diagnosis not present

## 2015-08-19 DIAGNOSIS — E1142 Type 2 diabetes mellitus with diabetic polyneuropathy: Secondary | ICD-10-CM | POA: Diagnosis not present

## 2015-08-19 DIAGNOSIS — G894 Chronic pain syndrome: Secondary | ICD-10-CM | POA: Diagnosis not present

## 2015-08-19 DIAGNOSIS — M4727 Other spondylosis with radiculopathy, lumbosacral region: Secondary | ICD-10-CM | POA: Diagnosis not present

## 2015-10-10 DIAGNOSIS — E1142 Type 2 diabetes mellitus with diabetic polyneuropathy: Secondary | ICD-10-CM | POA: Diagnosis not present

## 2015-10-10 DIAGNOSIS — G894 Chronic pain syndrome: Secondary | ICD-10-CM | POA: Diagnosis not present

## 2015-10-10 DIAGNOSIS — M4727 Other spondylosis with radiculopathy, lumbosacral region: Secondary | ICD-10-CM | POA: Diagnosis not present

## 2015-12-12 DIAGNOSIS — M47817 Spondylosis without myelopathy or radiculopathy, lumbosacral region: Secondary | ICD-10-CM | POA: Diagnosis not present

## 2015-12-12 DIAGNOSIS — E1142 Type 2 diabetes mellitus with diabetic polyneuropathy: Secondary | ICD-10-CM | POA: Diagnosis not present

## 2015-12-12 DIAGNOSIS — G894 Chronic pain syndrome: Secondary | ICD-10-CM | POA: Diagnosis not present

## 2015-12-12 DIAGNOSIS — M5417 Radiculopathy, lumbosacral region: Secondary | ICD-10-CM | POA: Diagnosis not present

## 2016-02-06 DIAGNOSIS — Z79891 Long term (current) use of opiate analgesic: Secondary | ICD-10-CM | POA: Diagnosis not present

## 2016-02-06 DIAGNOSIS — G894 Chronic pain syndrome: Secondary | ICD-10-CM | POA: Diagnosis not present

## 2016-02-06 DIAGNOSIS — E1142 Type 2 diabetes mellitus with diabetic polyneuropathy: Secondary | ICD-10-CM | POA: Diagnosis not present

## 2016-02-06 DIAGNOSIS — M47817 Spondylosis without myelopathy or radiculopathy, lumbosacral region: Secondary | ICD-10-CM | POA: Diagnosis not present

## 2016-02-06 DIAGNOSIS — M5417 Radiculopathy, lumbosacral region: Secondary | ICD-10-CM | POA: Diagnosis not present

## 2016-03-10 DIAGNOSIS — L03032 Cellulitis of left toe: Secondary | ICD-10-CM | POA: Diagnosis not present

## 2016-03-22 DIAGNOSIS — Z794 Long term (current) use of insulin: Secondary | ICD-10-CM | POA: Diagnosis not present

## 2016-03-23 DIAGNOSIS — E1140 Type 2 diabetes mellitus with diabetic neuropathy, unspecified: Secondary | ICD-10-CM | POA: Diagnosis not present

## 2016-03-23 DIAGNOSIS — I25111 Atherosclerotic heart disease of native coronary artery with angina pectoris with documented spasm: Secondary | ICD-10-CM | POA: Diagnosis not present

## 2016-03-23 DIAGNOSIS — I119 Hypertensive heart disease without heart failure: Secondary | ICD-10-CM | POA: Diagnosis not present

## 2016-03-23 DIAGNOSIS — F321 Major depressive disorder, single episode, moderate: Secondary | ICD-10-CM | POA: Diagnosis not present

## 2016-03-23 DIAGNOSIS — Z794 Long term (current) use of insulin: Secondary | ICD-10-CM | POA: Diagnosis not present

## 2016-03-23 DIAGNOSIS — E782 Mixed hyperlipidemia: Secondary | ICD-10-CM | POA: Diagnosis not present

## 2016-03-23 DIAGNOSIS — K219 Gastro-esophageal reflux disease without esophagitis: Secondary | ICD-10-CM | POA: Diagnosis not present

## 2016-03-23 DIAGNOSIS — I10 Essential (primary) hypertension: Secondary | ICD-10-CM | POA: Diagnosis not present

## 2016-03-23 DIAGNOSIS — Z4681 Encounter for fitting and adjustment of insulin pump: Secondary | ICD-10-CM | POA: Diagnosis not present

## 2016-03-26 ENCOUNTER — Encounter: Payer: Self-pay | Admitting: Cardiovascular Disease

## 2016-03-30 ENCOUNTER — Ambulatory Visit (INDEPENDENT_AMBULATORY_CARE_PROVIDER_SITE_OTHER): Payer: BLUE CROSS/BLUE SHIELD | Admitting: Cardiovascular Disease

## 2016-03-30 ENCOUNTER — Encounter: Payer: Self-pay | Admitting: Cardiovascular Disease

## 2016-03-30 VITALS — BP 149/65 | HR 56 | Ht <= 58 in | Wt 177.0 lb

## 2016-03-30 DIAGNOSIS — I10 Essential (primary) hypertension: Secondary | ICD-10-CM

## 2016-03-30 DIAGNOSIS — I2510 Atherosclerotic heart disease of native coronary artery without angina pectoris: Secondary | ICD-10-CM

## 2016-03-30 MED ORDER — CARVEDILOL 6.25 MG PO TABS: 6.2500 mg | ORAL_TABLET | Freq: Two times a day (BID) | ORAL | Status: DC

## 2016-03-30 NOTE — Progress Notes (Signed)
Cardiology Office Note   Date:  03/30/2016   ID:  [name], DOB 07/12/1966, MRN [id number]  PCP:  Haskel KhanJOHNSON,ANDREA, PA-C  Cardiologist:   Lorine BearsMuhammad Selestino Nila, MD   Chief Complaint  Patient presents with  . Follow-up    SOB;none  CHEST PAIN;none  LIGHTHEADED/DIZZINESS; none PAIN OR CRAMPING IN LEGS;cramping in legs EDEMA; in both legs      History of Present Illness: [name] is a 50 y.o. female who presents for a followup visit regarding coronary artery disease. She is status post inferior myocardial infarction in July 2010. At that time she had an angioplasty and bare-metal stent placement to the right coronary artery.  Most recent stress test was in August of 2014  showed no evidence of ischemia with normal ejection fraction.  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 She is on longterm Fentanyl patch.  Unfortunately, she continues to smoke one pack per day. She denies any chest discomfort at this time. Exertional dyspnea is stable.  Previous ABI in 2015 was normal. Diabetes control has improved significantly with insulin pump. Overall, she has been doing well over the last year. Blood pressure has been elevated recently. Lisinopril was increased by her primary care physician to 20 mg daily but that caused a drop in blood pressure and thus she went back on 10 mg daily.   Past Medical History  Diagnosis Date  . MI (myocardial infarction) (HCC)   . DM2 (diabetes mellitus, type 2) (HCC)   . Sympathetic reflex dystrophy   . CAD (coronary artery disease)     s/p MI & stenting  . HLD (hyperlipidemia)   . HTN (hypertension)   . Tobacco abuse     Past Surgical History  Procedure Laterality Date  . Cholecystectomy  2006  . Carpal tunnel release  2002    left  . Dilation and curettage of uterus  1992 & 2004  . Tonsillectomy  1971  . Tubal ligation  2005  . Coronary stent placement  2010  . Cardiac catheterization  06/05/2009    Inferior  MI. Severe 1 vessel CAD (RCA)  . Coronary angioplasty with stent placement  06/05/2009    BMS (4.0 X 23 mm) Vision stent to RCA     Current Outpatient Prescriptions  Medication Sig Dispense Refill  . aspirin 325 MG tablet Take 325 mg by mouth daily.      Marland Kitchen. atorvastatin (LIPITOR) 80 MG tablet Take 1 tablet (80 mg total) by mouth every evening. 30 tablet 6  . DULoxetine (CYMBALTA) 30 MG capsule Take 30 mg by mouth daily.  1  . fentaNYL (DURAGESIC - DOSED MCG/HR) 75 MCG/HR Place 1 patch onto the skin daily.  0  . folic acid (FOLVITE) 400 MCG tablet Take 1,000 mcg by mouth daily.     . furosemide (LASIX) 20 MG tablet Take 20 mg by mouth daily as needed.     Marland Kitchen. HUMALOG 100 UNIT/ML injection Insulin Pump. Sliding scale    . metoprolol succinate (TOPROL-XL) 25 MG 24 hr tablet TAKE 1 TABLET ONCE DAILY. 30 tablet 6  . nitroGLYCERIN (NITROSTAT) 0.4 MG SL tablet Place 1 tablet (0.4 mg total) under the tongue every 5 (five) minutes as needed. 25 tablet 1  . Oxycodone HCl 10 MG TABS Take 1 tablet by mouth 3 (three) times daily.    . pantoprazole (PROTONIX) 40 MG tablet Take 40 mg by mouth daily.      Marland Kitchen. ZESTRIL 10  MG tablet TAKE 1 TABLET ONCE DAILY. 30 each 6   No current facility-administered medications for this visit.    Allergies:   Review of patient's allergies indicates no known allergies.    Social History:  The patient  reports that she has been smoking.  She does not have any smokeless tobacco history on file. She reports that she does not drink alcohol or use illicit drugs.   Family History:  The patient's family history includes Arrhythmia in her father; Huntington's disease in her mother; Hyperlipidemia in her father; Hypertension in her father.    ROS:  Please see the history of present illness.   Otherwise, review of systems are positive for none.   All other systems are reviewed and negative.    PHYSICAL EXAM: VS:  BP 149/65 mmHg  Pulse 56  Ht  (1.473 m)  Wt 177 lb  (80.287 kg)  BMI 37.00 kg/m2 , BMI Body mass index is 37 kg/(m^2). GEN: Well nourished, well developed, in no acute distress HEENT: normal Neck: no JVD, carotid bruits, or masses Cardiac: RRR; no  rubs, or gallops,no edema . There is one out of 6 systolic ejection murmur in the aortic area and Respiratory:  clear to auscultation bilaterally, normal work of breathing GI: soft, nontender, nondistended, + BS MS: no deformity or atrophy Skin: warm and dry, no rash Neuro:  Strength and sensation are intact Psych: euthymic mood, full affect   EKG:  EKG is ordered today. The ekg ordered today demonstrates sinus bradycardia with no significant ST or T wave changes.   Recent Labs: No results found for requested labs within last 365 days.    Lipid Panel No results found for: CHOL, TRIG, HDL, CHOLHDL, VLDL, LDLCALC, LDLDIRECT    Wt Readings from Last 3 Encounters:  03/30/16 177 lb (80.287 kg)  02/11/15 178 lb 1.9 oz (80.795 kg)  02/05/14 168 lb (76.204 kg)      Other studies Reviewed: Additional studies/ records that were reviewed today include: Recent labs. Review of the above records demonstrates: CBC was unremarkable. Basic metabolic profile showed normal kidney function. Cholesterol was 143, triglyceride 100, HDL 50 and LDL was 73. Hemoglobin A1c was 7.6.   ASSESSMENT AND PLAN:  1.  Coronary artery disease involving native coronary arteries without angina: She is overall doing very well with no anginal symptoms. Continue medical therapy.  2. Essential hypertension: Blood pressure is elevated and she is mildly bradycardic. Thus, I elected to switch metoprolol to carvedilol 6.25 mg twice daily.  3. Hyperlipidemia: Continue high dose atorvastatin. Most recent LDL was 73 which is close to baseline.  4. Tobacco use: I discussed with her the importance of smoking cessation.    Disposition:   FU with me in 1 year  Signed,  Lorine Bears, MD  03/30/2016 9:32 AM    [facility]

## 2016-03-30 NOTE — Patient Instructions (Addendum)
Medication Instructions:  Your physician has recommended you make the following change in your medication:  1. STOP Metoprolol Succinate 2. START Carvedilol 6.25mg  take one tablet by mouth twice a day  Labwork: No new orders.   Testing/Procedures: No new orders.   Follow-Up: Your physician wants you to follow-up in: 1 YEAR with Dr Kirke CorinArida.  You will receive a reminder letter in the mail two months in advance. If you don't receive a letter, please call our office to schedule the follow-up appointment.   Any Other Special Instructions Will Be Listed Below (If Applicable).     If you need a refill on your cardiac medications before your next appointment, please call your pharmacy.

## 2016-04-02 DIAGNOSIS — M47817 Spondylosis without myelopathy or radiculopathy, lumbosacral region: Secondary | ICD-10-CM | POA: Diagnosis not present

## 2016-04-02 DIAGNOSIS — E1142 Type 2 diabetes mellitus with diabetic polyneuropathy: Secondary | ICD-10-CM | POA: Diagnosis not present

## 2016-04-02 DIAGNOSIS — G894 Chronic pain syndrome: Secondary | ICD-10-CM | POA: Diagnosis not present

## 2016-04-02 DIAGNOSIS — M5417 Radiculopathy, lumbosacral region: Secondary | ICD-10-CM | POA: Diagnosis not present

## 2016-04-16 DIAGNOSIS — Z794 Long term (current) use of insulin: Secondary | ICD-10-CM | POA: Diagnosis not present

## 2016-04-28 DIAGNOSIS — Z1231 Encounter for screening mammogram for malignant neoplasm of breast: Secondary | ICD-10-CM | POA: Diagnosis not present

## 2016-06-07 DIAGNOSIS — Z79891 Long term (current) use of opiate analgesic: Secondary | ICD-10-CM | POA: Diagnosis not present

## 2016-06-07 DIAGNOSIS — M5417 Radiculopathy, lumbosacral region: Secondary | ICD-10-CM | POA: Diagnosis not present

## 2016-06-07 DIAGNOSIS — G894 Chronic pain syndrome: Secondary | ICD-10-CM | POA: Diagnosis not present

## 2016-06-07 DIAGNOSIS — E1142 Type 2 diabetes mellitus with diabetic polyneuropathy: Secondary | ICD-10-CM | POA: Diagnosis not present

## 2016-06-07 DIAGNOSIS — M47817 Spondylosis without myelopathy or radiculopathy, lumbosacral region: Secondary | ICD-10-CM | POA: Diagnosis not present

## 2016-06-23 DIAGNOSIS — F321 Major depressive disorder, single episode, moderate: Secondary | ICD-10-CM | POA: Diagnosis not present

## 2016-06-23 DIAGNOSIS — Z794 Long term (current) use of insulin: Secondary | ICD-10-CM | POA: Diagnosis not present

## 2016-06-23 DIAGNOSIS — I119 Hypertensive heart disease without heart failure: Secondary | ICD-10-CM | POA: Diagnosis not present

## 2016-06-23 DIAGNOSIS — K219 Gastro-esophageal reflux disease without esophagitis: Secondary | ICD-10-CM | POA: Diagnosis not present

## 2016-06-23 DIAGNOSIS — I25111 Atherosclerotic heart disease of native coronary artery with angina pectoris with documented spasm: Secondary | ICD-10-CM | POA: Diagnosis not present

## 2016-06-23 DIAGNOSIS — E782 Mixed hyperlipidemia: Secondary | ICD-10-CM | POA: Diagnosis not present

## 2016-06-23 DIAGNOSIS — I10 Essential (primary) hypertension: Secondary | ICD-10-CM | POA: Diagnosis not present

## 2016-06-23 DIAGNOSIS — Z4681 Encounter for fitting and adjustment of insulin pump: Secondary | ICD-10-CM | POA: Diagnosis not present

## 2016-06-23 DIAGNOSIS — E1140 Type 2 diabetes mellitus with diabetic neuropathy, unspecified: Secondary | ICD-10-CM | POA: Diagnosis not present

## 2016-07-20 DIAGNOSIS — Z794 Long term (current) use of insulin: Secondary | ICD-10-CM | POA: Diagnosis not present

## 2016-08-02 DIAGNOSIS — E1142 Type 2 diabetes mellitus with diabetic polyneuropathy: Secondary | ICD-10-CM | POA: Diagnosis not present

## 2016-08-02 DIAGNOSIS — G894 Chronic pain syndrome: Secondary | ICD-10-CM | POA: Diagnosis not present

## 2016-08-02 DIAGNOSIS — M5417 Radiculopathy, lumbosacral region: Secondary | ICD-10-CM | POA: Diagnosis not present

## 2016-08-02 DIAGNOSIS — M47817 Spondylosis without myelopathy or radiculopathy, lumbosacral region: Secondary | ICD-10-CM | POA: Diagnosis not present

## 2016-09-23 DIAGNOSIS — Z1211 Encounter for screening for malignant neoplasm of colon: Secondary | ICD-10-CM | POA: Diagnosis not present

## 2016-09-23 DIAGNOSIS — I25111 Atherosclerotic heart disease of native coronary artery with angina pectoris with documented spasm: Secondary | ICD-10-CM | POA: Diagnosis not present

## 2016-09-23 DIAGNOSIS — I119 Hypertensive heart disease without heart failure: Secondary | ICD-10-CM | POA: Diagnosis not present

## 2016-09-23 DIAGNOSIS — E782 Mixed hyperlipidemia: Secondary | ICD-10-CM | POA: Diagnosis not present

## 2016-09-23 DIAGNOSIS — E1140 Type 2 diabetes mellitus with diabetic neuropathy, unspecified: Secondary | ICD-10-CM | POA: Diagnosis not present

## 2016-09-23 DIAGNOSIS — K219 Gastro-esophageal reflux disease without esophagitis: Secondary | ICD-10-CM | POA: Diagnosis not present

## 2016-09-23 DIAGNOSIS — F321 Major depressive disorder, single episode, moderate: Secondary | ICD-10-CM | POA: Diagnosis not present

## 2016-09-23 DIAGNOSIS — Z794 Long term (current) use of insulin: Secondary | ICD-10-CM | POA: Diagnosis not present

## 2016-10-05 DIAGNOSIS — Z1212 Encounter for screening for malignant neoplasm of rectum: Secondary | ICD-10-CM | POA: Diagnosis not present

## 2016-10-05 DIAGNOSIS — Z1211 Encounter for screening for malignant neoplasm of colon: Secondary | ICD-10-CM | POA: Diagnosis not present

## 2016-10-06 DIAGNOSIS — G894 Chronic pain syndrome: Secondary | ICD-10-CM | POA: Diagnosis not present

## 2016-10-06 DIAGNOSIS — E1142 Type 2 diabetes mellitus with diabetic polyneuropathy: Secondary | ICD-10-CM | POA: Diagnosis not present

## 2016-10-06 DIAGNOSIS — M47817 Spondylosis without myelopathy or radiculopathy, lumbosacral region: Secondary | ICD-10-CM | POA: Diagnosis not present

## 2016-10-06 DIAGNOSIS — M5417 Radiculopathy, lumbosacral region: Secondary | ICD-10-CM | POA: Diagnosis not present

## 2016-10-12 DIAGNOSIS — J018 Other acute sinusitis: Secondary | ICD-10-CM | POA: Diagnosis not present

## 2016-11-26 DIAGNOSIS — E1140 Type 2 diabetes mellitus with diabetic neuropathy, unspecified: Secondary | ICD-10-CM | POA: Diagnosis not present

## 2016-11-26 DIAGNOSIS — Z794 Long term (current) use of insulin: Secondary | ICD-10-CM | POA: Diagnosis not present

## 2016-12-14 DIAGNOSIS — M5417 Radiculopathy, lumbosacral region: Secondary | ICD-10-CM | POA: Diagnosis not present

## 2016-12-14 DIAGNOSIS — E1142 Type 2 diabetes mellitus with diabetic polyneuropathy: Secondary | ICD-10-CM | POA: Diagnosis not present

## 2016-12-14 DIAGNOSIS — M47817 Spondylosis without myelopathy or radiculopathy, lumbosacral region: Secondary | ICD-10-CM | POA: Diagnosis not present

## 2016-12-14 DIAGNOSIS — G894 Chronic pain syndrome: Secondary | ICD-10-CM | POA: Diagnosis not present

## 2016-12-27 DIAGNOSIS — E1140 Type 2 diabetes mellitus with diabetic neuropathy, unspecified: Secondary | ICD-10-CM | POA: Diagnosis not present

## 2016-12-27 DIAGNOSIS — F321 Major depressive disorder, single episode, moderate: Secondary | ICD-10-CM | POA: Diagnosis not present

## 2016-12-27 DIAGNOSIS — E782 Mixed hyperlipidemia: Secondary | ICD-10-CM | POA: Diagnosis not present

## 2016-12-27 DIAGNOSIS — I25111 Atherosclerotic heart disease of native coronary artery with angina pectoris with documented spasm: Secondary | ICD-10-CM | POA: Diagnosis not present

## 2016-12-27 DIAGNOSIS — I119 Hypertensive heart disease without heart failure: Secondary | ICD-10-CM | POA: Diagnosis not present

## 2016-12-27 DIAGNOSIS — K219 Gastro-esophageal reflux disease without esophagitis: Secondary | ICD-10-CM | POA: Diagnosis not present

## 2016-12-27 DIAGNOSIS — Z794 Long term (current) use of insulin: Secondary | ICD-10-CM | POA: Diagnosis not present

## 2017-02-08 DIAGNOSIS — E1142 Type 2 diabetes mellitus with diabetic polyneuropathy: Secondary | ICD-10-CM | POA: Diagnosis not present

## 2017-02-08 DIAGNOSIS — M47817 Spondylosis without myelopathy or radiculopathy, lumbosacral region: Secondary | ICD-10-CM | POA: Diagnosis not present

## 2017-02-08 DIAGNOSIS — G894 Chronic pain syndrome: Secondary | ICD-10-CM | POA: Diagnosis not present

## 2017-02-08 DIAGNOSIS — M5417 Radiculopathy, lumbosacral region: Secondary | ICD-10-CM | POA: Diagnosis not present

## 2017-03-01 ENCOUNTER — Ambulatory Visit: Payer: BLUE CROSS/BLUE SHIELD | Admitting: Cardiovascular Disease

## 2017-03-03 DIAGNOSIS — J00 Acute nasopharyngitis [common cold]: Secondary | ICD-10-CM | POA: Diagnosis not present

## 2017-03-15 ENCOUNTER — Other Ambulatory Visit: Payer: Self-pay | Admitting: Cardiovascular Disease

## 2017-03-15 DIAGNOSIS — I10 Essential (primary) hypertension: Secondary | ICD-10-CM

## 2017-03-15 DIAGNOSIS — I2510 Atherosclerotic heart disease of native coronary artery without angina pectoris: Secondary | ICD-10-CM

## 2017-03-15 NOTE — Telephone Encounter (Signed)
Pt needs f/u appt with Arida. Thanks! 

## 2017-03-16 NOTE — Telephone Encounter (Signed)
l mom to schedule f/u 

## 2017-03-23 DIAGNOSIS — K219 Gastro-esophageal reflux disease without esophagitis: Secondary | ICD-10-CM | POA: Diagnosis not present

## 2017-03-23 DIAGNOSIS — F321 Major depressive disorder, single episode, moderate: Secondary | ICD-10-CM | POA: Diagnosis not present

## 2017-03-23 DIAGNOSIS — I25111 Atherosclerotic heart disease of native coronary artery with angina pectoris with documented spasm: Secondary | ICD-10-CM | POA: Diagnosis not present

## 2017-03-23 DIAGNOSIS — Z4681 Encounter for fitting and adjustment of insulin pump: Secondary | ICD-10-CM | POA: Diagnosis not present

## 2017-03-23 DIAGNOSIS — I119 Hypertensive heart disease without heart failure: Secondary | ICD-10-CM | POA: Diagnosis not present

## 2017-03-23 DIAGNOSIS — E1140 Type 2 diabetes mellitus with diabetic neuropathy, unspecified: Secondary | ICD-10-CM | POA: Diagnosis not present

## 2017-03-23 DIAGNOSIS — E782 Mixed hyperlipidemia: Secondary | ICD-10-CM | POA: Diagnosis not present

## 2017-03-23 DIAGNOSIS — Z794 Long term (current) use of insulin: Secondary | ICD-10-CM | POA: Diagnosis not present

## 2017-03-24 DIAGNOSIS — R0602 Shortness of breath: Secondary | ICD-10-CM | POA: Diagnosis not present

## 2017-03-24 DIAGNOSIS — R05 Cough: Secondary | ICD-10-CM | POA: Diagnosis not present

## 2017-04-04 DIAGNOSIS — G894 Chronic pain syndrome: Secondary | ICD-10-CM | POA: Diagnosis not present

## 2017-04-04 DIAGNOSIS — M47817 Spondylosis without myelopathy or radiculopathy, lumbosacral region: Secondary | ICD-10-CM | POA: Diagnosis not present

## 2017-04-04 DIAGNOSIS — E1142 Type 2 diabetes mellitus with diabetic polyneuropathy: Secondary | ICD-10-CM | POA: Diagnosis not present

## 2017-04-04 DIAGNOSIS — M5417 Radiculopathy, lumbosacral region: Secondary | ICD-10-CM | POA: Diagnosis not present

## 2017-04-11 NOTE — Progress Notes (Signed)
Cardiology Office Note   Date:  04/12/2017   ID:  ROBINN OVERHOLT, DOB February 17, 1966, MRN [id number]  PCP:  Gus Height, PA-C  Cardiologist:   Lorine Bears, MD   Chief Complaint  Patient presents with  . Follow-up    CAD      History of Present Illness: [name] is a 51 y.o. female who presents for a followup visit regarding coronary artery disease. She is status post inferior myocardial infarction in July 2010. At that time she had an angioplasty and bare-metal stent placement to the right coronary artery.  Most recent stress test was in August of 2014  showed no evidence of ischemia with normal ejection fraction.  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 She is on longterm Fentanyl patch. Previous ABI in 2015 was normal. Diabetes control has improved significantly with insulin pump.  During last visit, I switched metoprolol to carvedilol. Blood pressure improved since then but still not controlled. She is doing reasonably well and denies any chest pain or shortness of breath. Her biggest complaint seems to be bilateral leg cramps especially at night. She continues to smoke.  Past Medical History:  Diagnosis Date  . CAD (coronary artery disease)    s/p MI & stenting  . DM2 (diabetes mellitus, type 2) (HCC)   . HLD (hyperlipidemia)   . HTN (hypertension)   . MI (myocardial infarction) (HCC)   . Sympathetic reflex dystrophy   . Tobacco abuse     Past Surgical History:  Procedure Laterality Date  . CARDIAC CATHETERIZATION  06/05/2009   Inferior MI. Severe 1 vessel CAD (RCA)  . CARPAL TUNNEL RELEASE  2002   left  . CHOLECYSTECTOMY  2006  . CORONARY ANGIOPLASTY WITH STENT PLACEMENT  06/05/2009   BMS (4.0 X 23 mm) Vision stent to RCA  . CORONARY STENT PLACEMENT  2010  . DILATION AND CURETTAGE OF UTERUS  1992 & 2004  . TONSILLECTOMY  1971  . TUBAL LIGATION  2005     Current Outpatient Prescriptions  Medication Sig Dispense Refill   . aspirin 325 MG tablet Take 325 mg by mouth daily.      Marland Kitchen atorvastatin (LIPITOR) 80 MG tablet Take 1 tablet (80 mg total) by mouth every evening. 30 tablet 6  . carvedilol (COREG) 6.25 MG tablet TAKE ONE TABLET TWICE DAILY 60 tablet 0  . DULoxetine (CYMBALTA) 30 MG capsule Take 30 mg by mouth daily.  1  . FentaNYL 37.5 MCG/HR PT72 Apply 1 patch topically every other day.  0  . folic acid (FOLVITE) 400 MCG tablet Take 1,000 mcg by mouth daily.     . furosemide (LASIX) 20 MG tablet Take 20 mg by mouth daily as needed.     . gabapentin (NEURONTIN) 100 MG capsule Take 100 mg by mouth daily.  0  . HUMALOG 100 UNIT/ML injection Insulin Pump. Sliding scale    . nitroGLYCERIN (NITROSTAT) 0.4 MG SL tablet Place 1 tablet (0.4 mg total) under the tongue every 5 (five) minutes as needed. 25 tablet 1  . Oxycodone HCl 10 MG TABS Take 1 tablet by mouth 3 (three) times daily.    . pantoprazole (PROTONIX) 40 MG tablet Take 40 mg by mouth daily.      Marland Kitchen ZESTRIL 10 MG tablet TAKE 1 TABLET ONCE DAILY. 30 each 6   No current facility-administered medications for this visit.     Allergies:   Patient has no known allergies.  Social History:  The patient  reports that she has been smoking.  She has a 20.00 pack-year smoking history. She has never used smokeless tobacco. She reports that she does not drink alcohol or use drugs.   Family History:  The patient's family history includes Arrhythmia in her father; Huntington's disease in her mother; Hyperlipidemia in her father; Hypertension in her father.    ROS:  Please see the history of present illness.   Otherwise, review of systems are positive for none.   All other systems are reviewed and negative.    PHYSICAL EXAM: VS:  BP 140/76   Pulse 69   Ht 4\' 10"  (1.473 m)   Wt 179 lb 12.8 oz (81.6 kg)   BMI 37.58 kg/m  , BMI Body mass index is 37.58 kg/m. GEN: Well nourished, well developed, in no acute distress  HEENT: normal  Neck: no JVD, carotid  bruits, or masses Cardiac: RRR; no  rubs, or gallops,no edema . There is 1/ of 6 systolic ejection murmur in the aortic area and Respiratory:  clear to auscultation bilaterally, normal work of breathing GI: soft, nontender, nondistended, + BS MS: no deformity or atrophy  Skin: warm and dry, no rash Neuro:  Strength and sensation are intact Psych: euthymic mood, full affect She has palpable posterior tibial pulses bilaterally.  EKG:  EKG is ordered today. The ekg ordered today demonstrates sinus bradycardia with no significant ST or T wave changes.   Recent Labs: No results found for requested labs within last 8760 hours.    Lipid Panel No results found for: CHOL, TRIG, HDL, CHOLHDL, VLDL, LDLCALC, LDLDIRECT    Wt Readings from Last 3 Encounters:  04/12/17 179 lb 12.8 oz (81.6 kg)  03/30/16 177 lb (80.3 kg)  02/11/15 178 lb 1.9 oz (80.8 kg)      Other studies Reviewed: Additional studies/ records that were reviewed today include: Recent labs. Review of the above records demonstrates: CBC was unremarkable. Basic metabolic profile showed normal kidney function. Cholesterol was 165, triglyceride 78 and LDL was 80. Hemoglobin A1c was 8.   ASSESSMENT AND PLAN:  1.  Coronary artery disease involving native coronary arteries without angina: She is overall doing very well with no anginal symptoms. Continue medical therapy.  2. Essential hypertension: Blood pressure improved on carvedilol but is still not controlled. I elected to increase lisinopril to 20 mg daily.   3. Hyperlipidemia: Continue high dose atorvastatin. Most recent LDL was 80 which is close to baseline.  4. Tobacco use: I discussed with her the importance of smoking cessation.  5. Bilateral leg cramps: Likely neuropathy. Distal pulses are palpable and previous ABI in 2015 was normal. I suggested trying magnesium supplement as a trial.    Disposition:   FU with me in 1 year  Signed,  Lorine BearsMuhammad Jasen Hartstein, MD    04/12/2017 9:45 AM    [facility]

## 2017-04-12 ENCOUNTER — Encounter: Payer: Self-pay | Admitting: Cardiovascular Disease

## 2017-04-12 ENCOUNTER — Ambulatory Visit (INDEPENDENT_AMBULATORY_CARE_PROVIDER_SITE_OTHER): Payer: BLUE CROSS/BLUE SHIELD | Admitting: Cardiovascular Disease

## 2017-04-12 VITALS — BP 140/76 | HR 69 | Ht <= 58 in | Wt 179.8 lb

## 2017-04-12 DIAGNOSIS — Z720 Tobacco use: Secondary | ICD-10-CM

## 2017-04-12 DIAGNOSIS — I10 Essential (primary) hypertension: Secondary | ICD-10-CM | POA: Diagnosis not present

## 2017-04-12 DIAGNOSIS — E785 Hyperlipidemia, unspecified: Secondary | ICD-10-CM | POA: Diagnosis not present

## 2017-04-12 DIAGNOSIS — I2510 Atherosclerotic heart disease of native coronary artery without angina pectoris: Secondary | ICD-10-CM | POA: Diagnosis not present

## 2017-04-12 MED ORDER — LISINOPRIL 20 MG PO TABS: 20.0000 mg | ORAL_TABLET | Freq: Every day | ORAL | 11 refills | Status: DC

## 2017-04-12 NOTE — Patient Instructions (Signed)
Medication Instructions:  Your physician has recommended you make the following change in your medication:  1. INCREASE Lisinopril to 20mg take one tablet by mouth daily  Labwork: No new orders.   Testing/Procedures: No new orders.   Follow-Up: Your physician wants you to follow-up in: 1 YEAR with Dr Arida.  You will receive a reminder letter in the mail two months in advance. If you don't receive a letter, please call our office to schedule the follow-up appointment.   Any Other Special Instructions Will Be Listed Below (If Applicable).     If you need a refill on your cardiac medications before your next appointment, please call your pharmacy.   

## 2017-04-19 ENCOUNTER — Other Ambulatory Visit: Payer: Self-pay | Admitting: Cardiovascular Disease

## 2017-04-19 DIAGNOSIS — I2510 Atherosclerotic heart disease of native coronary artery without angina pectoris: Secondary | ICD-10-CM

## 2017-04-19 DIAGNOSIS — I10 Essential (primary) hypertension: Secondary | ICD-10-CM

## 2017-04-29 DIAGNOSIS — Z794 Long term (current) use of insulin: Secondary | ICD-10-CM | POA: Diagnosis not present

## 2017-05-20 DIAGNOSIS — M5417 Radiculopathy, lumbosacral region: Secondary | ICD-10-CM | POA: Diagnosis not present

## 2017-05-20 DIAGNOSIS — E1142 Type 2 diabetes mellitus with diabetic polyneuropathy: Secondary | ICD-10-CM | POA: Diagnosis not present

## 2017-05-20 DIAGNOSIS — M47817 Spondylosis without myelopathy or radiculopathy, lumbosacral region: Secondary | ICD-10-CM | POA: Diagnosis not present

## 2017-05-20 DIAGNOSIS — G894 Chronic pain syndrome: Secondary | ICD-10-CM | POA: Diagnosis not present

## 2017-05-23 DIAGNOSIS — Z1231 Encounter for screening mammogram for malignant neoplasm of breast: Secondary | ICD-10-CM | POA: Diagnosis not present

## 2017-06-24 DIAGNOSIS — I2510 Atherosclerotic heart disease of native coronary artery without angina pectoris: Secondary | ICD-10-CM | POA: Diagnosis not present

## 2017-06-24 DIAGNOSIS — J208 Acute bronchitis due to other specified organisms: Secondary | ICD-10-CM | POA: Diagnosis not present

## 2017-06-24 DIAGNOSIS — E782 Mixed hyperlipidemia: Secondary | ICD-10-CM | POA: Diagnosis not present

## 2017-06-24 DIAGNOSIS — I252 Old myocardial infarction: Secondary | ICD-10-CM | POA: Diagnosis not present

## 2017-06-24 DIAGNOSIS — E1140 Type 2 diabetes mellitus with diabetic neuropathy, unspecified: Secondary | ICD-10-CM | POA: Diagnosis not present

## 2017-06-24 DIAGNOSIS — I119 Hypertensive heart disease without heart failure: Secondary | ICD-10-CM | POA: Diagnosis not present

## 2017-06-24 DIAGNOSIS — F321 Major depressive disorder, single episode, moderate: Secondary | ICD-10-CM | POA: Diagnosis not present

## 2017-06-24 DIAGNOSIS — B070 Plantar wart: Secondary | ICD-10-CM | POA: Diagnosis not present

## 2017-07-05 DIAGNOSIS — Z794 Long term (current) use of insulin: Secondary | ICD-10-CM | POA: Diagnosis not present

## 2017-07-05 DIAGNOSIS — L84 Corns and callosities: Secondary | ICD-10-CM | POA: Diagnosis not present

## 2017-07-05 DIAGNOSIS — E1140 Type 2 diabetes mellitus with diabetic neuropathy, unspecified: Secondary | ICD-10-CM | POA: Diagnosis not present

## 2017-07-15 DIAGNOSIS — E782 Mixed hyperlipidemia: Secondary | ICD-10-CM | POA: Diagnosis not present

## 2017-07-15 DIAGNOSIS — I119 Hypertensive heart disease without heart failure: Secondary | ICD-10-CM | POA: Diagnosis not present

## 2017-07-15 DIAGNOSIS — E1140 Type 2 diabetes mellitus with diabetic neuropathy, unspecified: Secondary | ICD-10-CM | POA: Diagnosis not present

## 2017-07-26 DIAGNOSIS — E875 Hyperkalemia: Secondary | ICD-10-CM | POA: Diagnosis not present

## 2017-07-28 DIAGNOSIS — M47817 Spondylosis without myelopathy or radiculopathy, lumbosacral region: Secondary | ICD-10-CM | POA: Diagnosis not present

## 2017-07-28 DIAGNOSIS — M5417 Radiculopathy, lumbosacral region: Secondary | ICD-10-CM | POA: Diagnosis not present

## 2017-07-28 DIAGNOSIS — G894 Chronic pain syndrome: Secondary | ICD-10-CM | POA: Diagnosis not present

## 2017-07-28 DIAGNOSIS — E1142 Type 2 diabetes mellitus with diabetic polyneuropathy: Secondary | ICD-10-CM | POA: Diagnosis not present

## 2017-08-11 DIAGNOSIS — E875 Hyperkalemia: Secondary | ICD-10-CM | POA: Diagnosis not present

## 2017-09-05 DIAGNOSIS — Z794 Long term (current) use of insulin: Secondary | ICD-10-CM | POA: Diagnosis not present

## 2017-09-12 ENCOUNTER — Encounter: Payer: Self-pay | Admitting: *Deleted

## 2017-09-20 ENCOUNTER — Ambulatory Visit: Payer: BLUE CROSS/BLUE SHIELD | Admitting: Physician Assistant

## 2017-09-22 DIAGNOSIS — E1142 Type 2 diabetes mellitus with diabetic polyneuropathy: Secondary | ICD-10-CM | POA: Diagnosis not present

## 2017-09-22 DIAGNOSIS — Z79891 Long term (current) use of opiate analgesic: Secondary | ICD-10-CM | POA: Diagnosis not present

## 2017-09-22 DIAGNOSIS — G894 Chronic pain syndrome: Secondary | ICD-10-CM | POA: Diagnosis not present

## 2017-09-22 DIAGNOSIS — M47817 Spondylosis without myelopathy or radiculopathy, lumbosacral region: Secondary | ICD-10-CM | POA: Diagnosis not present

## 2017-09-22 DIAGNOSIS — M5417 Radiculopathy, lumbosacral region: Secondary | ICD-10-CM | POA: Diagnosis not present

## 2017-10-10 ENCOUNTER — Telehealth: Payer: Self-pay | Admitting: Cardiovascular Disease

## 2017-10-10 NOTE — Telephone Encounter (Signed)
Closed Encounter  °

## 2017-10-18 ENCOUNTER — Ambulatory Visit: Payer: BLUE CROSS/BLUE SHIELD | Admitting: Cardiovascular Disease

## 2017-10-19 DIAGNOSIS — Z794 Long term (current) use of insulin: Secondary | ICD-10-CM | POA: Diagnosis not present

## 2017-10-19 DIAGNOSIS — R05 Cough: Secondary | ICD-10-CM | POA: Diagnosis not present

## 2017-10-19 DIAGNOSIS — F321 Major depressive disorder, single episode, moderate: Secondary | ICD-10-CM | POA: Diagnosis not present

## 2017-10-19 DIAGNOSIS — I119 Hypertensive heart disease without heart failure: Secondary | ICD-10-CM | POA: Diagnosis not present

## 2017-10-19 DIAGNOSIS — Z23 Encounter for immunization: Secondary | ICD-10-CM | POA: Diagnosis not present

## 2017-10-19 DIAGNOSIS — E782 Mixed hyperlipidemia: Secondary | ICD-10-CM | POA: Diagnosis not present

## 2017-10-19 DIAGNOSIS — K219 Gastro-esophageal reflux disease without esophagitis: Secondary | ICD-10-CM | POA: Diagnosis not present

## 2017-10-19 DIAGNOSIS — R0602 Shortness of breath: Secondary | ICD-10-CM | POA: Diagnosis not present

## 2017-10-19 DIAGNOSIS — I252 Old myocardial infarction: Secondary | ICD-10-CM | POA: Diagnosis not present

## 2017-10-19 DIAGNOSIS — E1142 Type 2 diabetes mellitus with diabetic polyneuropathy: Secondary | ICD-10-CM | POA: Diagnosis not present

## 2017-10-19 DIAGNOSIS — Z4681 Encounter for fitting and adjustment of insulin pump: Secondary | ICD-10-CM | POA: Diagnosis not present

## 2017-10-19 DIAGNOSIS — R300 Dysuria: Secondary | ICD-10-CM | POA: Diagnosis not present

## 2017-10-19 DIAGNOSIS — I2510 Atherosclerotic heart disease of native coronary artery without angina pectoris: Secondary | ICD-10-CM | POA: Diagnosis not present

## 2017-11-01 ENCOUNTER — Encounter: Payer: Self-pay | Admitting: Cardiovascular Disease

## 2017-11-01 ENCOUNTER — Ambulatory Visit (INDEPENDENT_AMBULATORY_CARE_PROVIDER_SITE_OTHER): Payer: BLUE CROSS/BLUE SHIELD | Admitting: Cardiovascular Disease

## 2017-11-01 VITALS — BP 158/50 | HR 63 | Ht <= 58 in | Wt 182.0 lb

## 2017-11-01 DIAGNOSIS — M79605 Pain in left leg: Secondary | ICD-10-CM

## 2017-11-01 DIAGNOSIS — I10 Essential (primary) hypertension: Secondary | ICD-10-CM

## 2017-11-01 DIAGNOSIS — I2510 Atherosclerotic heart disease of native coronary artery without angina pectoris: Secondary | ICD-10-CM

## 2017-11-01 DIAGNOSIS — Z720 Tobacco use: Secondary | ICD-10-CM | POA: Diagnosis not present

## 2017-11-01 DIAGNOSIS — R0602 Shortness of breath: Secondary | ICD-10-CM

## 2017-11-01 DIAGNOSIS — E785 Hyperlipidemia, unspecified: Secondary | ICD-10-CM

## 2017-11-01 MED ORDER — AMLODIPINE BESYLATE 5 MG PO TABS: 5.0000 mg | ORAL_TABLET | Freq: Every day | ORAL | 11 refills | Status: DC

## 2017-11-01 NOTE — Patient Instructions (Addendum)
Medication Instructions: START Amlodipine 5 mg tablet daily  If you need a refill on your cardiac medications before your next appointment, please call your pharmacy.    Procedures/Testing: Your physician has requested that you have an echocardiogram. Echocardiography is a painless test that uses sound waves to create images of your heart. It provides your doctor with information about the size and shape of your heart and how well your heart's chambers and valves are working. This procedure takes approximately one hour. There are no restrictions for this procedure. This will be done at [address], [address]  Your physician has requested that you have a lower extremity arterial doppler. This will be done at [address], [address]  Follow-Up: Your physician wants you to follow-up in: 3 months with Dr. Kirke CorinArida. You will receive a reminder letter in the mail two months in advance. If you don't receive a letter, please call our office at [phone] to schedule this follow-up appointment.   Thank you for choosing [facility] at [facility]!!

## 2017-11-01 NOTE — Progress Notes (Signed)
Cardiology Office Note   Date:  11/01/2017   ID:  [name], DOB 05/20/1966, MRN [id number]  PCP:  Gus HeightJohnson, Andrea, PA-C  Cardiologist:   Lorine BearsMuhammad Terrianna Holsclaw, MD   No chief complaint on file.     History of Present Illness: [name] is a 51 y.o. female who presents for a followup visit regarding coronary artery disease. She is status post inferior myocardial infarction in July 2010. At that time she had an angioplasty and bare-metal stent placement to the right coronary artery.  Most recent stress test was in August of 2014  showed no evidence of ischemia with normal ejection fraction.  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 She is on longterm Fentanyl patch. Previous ABI in 2015 was normal. She is on insulin pump for diabetes. She reports recent worsening of dyspnea, leg edema and weight gain.  The dose of furosemide was increased to 20 mg twice daily about 2 weeks ago.  Since then, her weight went down 8 pounds. Blood pressure continues to be uncontrolled.  Lisinopril was switched to Benicar by her primary care physician.  She reports that one medication caused hyperkalemia likely losartan based on her description.  She continues to take carvedilol twice daily and the dose was increased to 12.5 mg twice daily. No recent chest pain. She continues to have significant bilateral leg pain at rest, at night and with walking.  The symptoms are worse on the left side.   Past Medical History:  Diagnosis Date  . CAD (coronary artery disease)    s/p MI & stenting  . DM2 (diabetes mellitus, type 2) (HCC)   . HLD (hyperlipidemia)   . HTN (hypertension)   . MI (myocardial infarction) (HCC)   . Sympathetic reflex dystrophy   . Tobacco abuse     Past Surgical History:  Procedure Laterality Date  . CARDIAC CATHETERIZATION  06/05/2009   Inferior MI. Severe 1 vessel CAD (RCA)  . CARPAL TUNNEL RELEASE  2002   left  . CHOLECYSTECTOMY  2006  .  CORONARY ANGIOPLASTY WITH STENT PLACEMENT  06/05/2009   BMS (4.0 X 23 mm) Vision stent to RCA  . CORONARY STENT PLACEMENT  2010  . DILATION AND CURETTAGE OF UTERUS  1992 & 2004  . TONSILLECTOMY  1971  . TUBAL LIGATION  2005     Current Outpatient Medications  Medication Sig Dispense Refill  . aspirin 325 MG tablet Take 325 mg by mouth daily.      Marland Kitchen. atorvastatin (LIPITOR) 80 MG tablet Take 1 tablet (80 mg total) by mouth every evening. 30 tablet 6  . carvedilol (COREG) 12.5 MG tablet   1  . DULoxetine (CYMBALTA) 60 MG capsule TAKE ONE CAPSULE BY MOUTH DAILY    . FentaNYL 37.5 MCG/HR PT72 Apply 1 patch topically every other day.  0  . folic acid (FOLVITE) 400 MCG tablet Take 1,000 mcg by mouth daily.     . furosemide (LASIX) 20 MG tablet Take 20 mg by mouth daily as needed.     . gabapentin (NEURONTIN) 100 MG capsule Take 100 mg by mouth daily.  0  . glycopyrrolate (ROBINUL) 2 MG tablet Take 2 mg by mouth daily.    Marland Kitchen. HUMALOG 100 UNIT/ML injection Insulin Pump. Sliding scale    . magnesium gluconate (MAGONATE) 500 MG tablet Take 500 mg by mouth daily.    . nitroGLYCERIN (NITROSTAT) 0.4 MG SL tablet Place 1 tablet (0.4 mg total) under  the tongue every 5 (five) minutes as needed. 25 tablet 1  . OLMESARTAN MEDOXOMIL PO Take 40 mg by mouth daily.    . Oxycodone HCl 10 MG TABS Take 1 tablet by mouth 3 (three) times daily.    . pantoprazole (PROTONIX) 40 MG tablet Take 40 mg by mouth 2 (two) times daily.      No current facility-administered medications for this visit.     Allergies:   Patient has no known allergies.    Social History:  The patient  reports that she has been smoking.  She has a 20.00 pack-year smoking history. she has never used smokeless tobacco. She reports that she does not drink alcohol or use drugs.   Family History:  The patient's family history includes Arrhythmia in her father; Huntington's disease in her mother and unknown relative; Hyperlipidemia in her father;  Hypertension in her father.    ROS:  Please see the history of present illness.   Otherwise, review of systems are positive for none.   All other systems are reviewed and negative.    PHYSICAL EXAM: VS:  BP (!) 158/50   Pulse 63   Ht 4\' 10"  (1.473 m)   Wt 182 lb (82.6 kg)   SpO2 96%   BMI 38.04 kg/m  , BMI Body mass index is 38.04 kg/m. GEN: Well nourished, well developed, in no acute distress  HEENT: normal  Neck: no JVD, carotid bruits, or masses Cardiac: RRR; no  rubs, or gallops,no edema . There is 1/ of 6 systolic ejection murmur in the aortic area and Respiratory:  clear to auscultation bilaterally, normal work of breathing GI: soft, nontender, nondistended, + BS MS: no deformity or atrophy  Skin: warm and dry, no rash Neuro:  Strength and sensation are intact Psych: euthymic mood, full affect Vascular: Dorsalis pedis is not palpable bilaterally.  Posterior tibial is +2 on the right side and +1 on the left side.  EKG:  EKG is ordered today. The ekg ordered today demonstrates normal sinus rhythm with no significant ST or T wave changes.   Recent Labs: No results found for requested labs within last 8760 hours.    Lipid Panel No results found for: CHOL, TRIG, HDL, CHOLHDL, VLDL, LDLCALC, LDLDIRECT    Wt Readings from Last 3 Encounters:  11/01/17 182 lb (82.6 kg)  04/12/17 179 lb 12.8 oz (81.6 kg)  03/30/16 177 lb (80.3 kg)      Other studies Reviewed: Additional studies/ records that were reviewed today include: Recent labs. Review of the above records demonstrates: CBC was unremarkable. Basic metabolic profile showed normal kidney function. Cholesterol was 165, triglyceride 78 and LDL was 80. Hemoglobin A1c was 8.   ASSESSMENT AND PLAN:  1.  Coronary artery disease involving native coronary arteries without angina: No recent chest pain.  EKG is normal.  Continue medical therapy.  2.  Worsening dyspnea, leg edema and weight gain: Concerning for diastolic  heart failure.  I requested an echocardiogram for evaluation.  Symptoms improved with increasing the dose of furosemide to 20 mg twice daily.  3. Essential hypertension: Blood pressure continues to be uncontrolled.  I elected to add amlodipine 5 mg once daily.  3. Hyperlipidemia: Continue high dose atorvastatin.   4. Tobacco use: I discussed with her the importance of smoking cessation.  5. Bilateral leg pain worse on the left side.  Pulses are diminished on the left side.  I requested lower extremity arterial Doppler.  I suspect that her  pain is likely multifactorial with neuropathic component.   Disposition:   FU with me in 3 months  Signed,  Lorine BearsMuhammad Cobey Raineri, MD  11/01/2017 10:54 AM    [facility]

## 2017-11-04 ENCOUNTER — Other Ambulatory Visit: Payer: Self-pay | Admitting: Cardiovascular Disease

## 2017-11-04 DIAGNOSIS — I739 Peripheral vascular disease, unspecified: Secondary | ICD-10-CM

## 2017-11-04 DIAGNOSIS — M79605 Pain in left leg: Secondary | ICD-10-CM

## 2017-11-10 ENCOUNTER — Ambulatory Visit (HOSPITAL_COMMUNITY): Payer: BLUE CROSS/BLUE SHIELD | Attending: Cardiovascular Disease

## 2017-11-10 ENCOUNTER — Other Ambulatory Visit: Payer: Self-pay

## 2017-11-10 DIAGNOSIS — Z720 Tobacco use: Secondary | ICD-10-CM | POA: Diagnosis not present

## 2017-11-10 DIAGNOSIS — R0602 Shortness of breath: Secondary | ICD-10-CM | POA: Insufficient documentation

## 2017-11-10 DIAGNOSIS — I10 Essential (primary) hypertension: Secondary | ICD-10-CM | POA: Diagnosis not present

## 2017-11-10 DIAGNOSIS — E119 Type 2 diabetes mellitus without complications: Secondary | ICD-10-CM | POA: Diagnosis not present

## 2017-11-10 DIAGNOSIS — E785 Hyperlipidemia, unspecified: Secondary | ICD-10-CM | POA: Diagnosis not present

## 2017-11-10 DIAGNOSIS — I071 Rheumatic tricuspid insufficiency: Secondary | ICD-10-CM | POA: Diagnosis not present

## 2017-11-10 DIAGNOSIS — I2510 Atherosclerotic heart disease of native coronary artery without angina pectoris: Secondary | ICD-10-CM | POA: Diagnosis not present

## 2017-11-10 MED ORDER — PERFLUTREN LIPID MICROSPHERE
1.0000 mL | INTRAVENOUS | Status: AC | PRN
  Administered 2017-11-10: 3 mL via INTRAVENOUS

## 2017-11-24 ENCOUNTER — Ambulatory Visit (HOSPITAL_COMMUNITY)
Admission: RE | Admit: 2017-11-24 | Payer: BLUE CROSS/BLUE SHIELD | Source: Ambulatory Visit | Attending: Cardiovascular Disease | Admitting: Cardiovascular Disease

## 2017-12-08 ENCOUNTER — Ambulatory Visit (HOSPITAL_COMMUNITY)
Admission: RE | Admit: 2017-12-08 | Payer: BLUE CROSS/BLUE SHIELD | Source: Ambulatory Visit | Attending: Cardiovascular Disease | Admitting: Cardiovascular Disease

## 2017-12-16 DIAGNOSIS — E1142 Type 2 diabetes mellitus with diabetic polyneuropathy: Secondary | ICD-10-CM | POA: Diagnosis not present

## 2017-12-16 DIAGNOSIS — G894 Chronic pain syndrome: Secondary | ICD-10-CM | POA: Diagnosis not present

## 2017-12-16 DIAGNOSIS — M47817 Spondylosis without myelopathy or radiculopathy, lumbosacral region: Secondary | ICD-10-CM | POA: Diagnosis not present

## 2017-12-16 DIAGNOSIS — M5417 Radiculopathy, lumbosacral region: Secondary | ICD-10-CM | POA: Diagnosis not present

## 2017-12-22 ENCOUNTER — Ambulatory Visit (HOSPITAL_COMMUNITY)
Admission: RE | Admit: 2017-12-22 | Discharge: 2017-12-22 | Disposition: A | Discharge status: Home / Self Care | Payer: BLUE CROSS/BLUE SHIELD | Source: Ambulatory Visit | Attending: Cardiology | Admitting: Cardiology

## 2017-12-22 DIAGNOSIS — M79605 Pain in left leg: Secondary | ICD-10-CM

## 2017-12-22 DIAGNOSIS — I739 Peripheral vascular disease, unspecified: Secondary | ICD-10-CM | POA: Insufficient documentation

## 2018-01-23 DIAGNOSIS — M79661 Pain in right lower leg: Secondary | ICD-10-CM | POA: Diagnosis not present

## 2018-01-23 DIAGNOSIS — K219 Gastro-esophageal reflux disease without esophagitis: Secondary | ICD-10-CM | POA: Diagnosis not present

## 2018-01-23 DIAGNOSIS — E1142 Type 2 diabetes mellitus with diabetic polyneuropathy: Secondary | ICD-10-CM | POA: Diagnosis not present

## 2018-01-23 DIAGNOSIS — F321 Major depressive disorder, single episode, moderate: Secondary | ICD-10-CM | POA: Diagnosis not present

## 2018-01-23 DIAGNOSIS — I119 Hypertensive heart disease without heart failure: Secondary | ICD-10-CM | POA: Diagnosis not present

## 2018-01-23 DIAGNOSIS — Z4681 Encounter for fitting and adjustment of insulin pump: Secondary | ICD-10-CM | POA: Diagnosis not present

## 2018-01-23 DIAGNOSIS — M79662 Pain in left lower leg: Secondary | ICD-10-CM | POA: Diagnosis not present

## 2018-01-23 DIAGNOSIS — E782 Mixed hyperlipidemia: Secondary | ICD-10-CM | POA: Diagnosis not present

## 2018-01-23 DIAGNOSIS — I252 Old myocardial infarction: Secondary | ICD-10-CM | POA: Diagnosis not present

## 2018-01-23 DIAGNOSIS — I2510 Atherosclerotic heart disease of native coronary artery without angina pectoris: Secondary | ICD-10-CM | POA: Diagnosis not present

## 2018-01-31 ENCOUNTER — Ambulatory Visit: Payer: BLUE CROSS/BLUE SHIELD | Admitting: Cardiovascular Disease

## 2018-01-31 NOTE — Progress Notes (Deleted)
Cardiology Office Note   Date:  01/31/2018   ID:  Christine NuttingDenise L Jared, DOB 12/29/1965, MRN [id number]  PCP:  Gus HeightJohnson, Andrea, [name]  Cardiologist:   Lorine BearsMuhammad Arida, MD   No chief complaint on file.     History of Present Illness: [name] is a 52 y.o. female who presents for a followup visit regarding coronary artery disease. She is status post inferior myocardial infarction in July 2010. At that time she had an angioplasty and bare-metal stent placement to the right coronary artery.  Most recent stress test was in August of 2014 showed no evidence of ischemia with normal ejection fraction.  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 She is on longterm Fentanyl patch. Previous ABI in 2015 was normal. She is on insulin pump for diabetes. She reports recent worsening of dyspnea, leg edema and weight gain.  The dose of furosemide was increased to 20 mg twice daily about 2 weeks ago.  Since then, her weight went down 8 pounds. Blood pressure continues to be uncontrolled.  Lisinopril was switched to Benicar by her primary care physician.  She reports that one medication caused hyperkalemia likely losartan based on her description.  She continues to take carvedilol twice daily and the dose was increased to 12.5 mg twice daily. No recent chest pain. She continues to have significant bilateral leg pain at rest, at night and with walking.  The symptoms are worse on the left side.   Past Medical History:  Diagnosis Date  . CAD (coronary artery disease)    s/p MI & stenting  . DM2 (diabetes mellitus, type 2) (HCC)   . HLD (hyperlipidemia)   . HTN (hypertension)   . MI (myocardial infarction) (HCC)   . Sympathetic reflex dystrophy   . Tobacco abuse     Past Surgical History:  Procedure Laterality Date  . CARDIAC CATHETERIZATION  06/05/2009   Inferior MI. Severe 1 vessel CAD (RCA)  . CARPAL TUNNEL RELEASE  2002   left  . CHOLECYSTECTOMY  2006  . CORONARY  ANGIOPLASTY WITH STENT PLACEMENT  06/05/2009   BMS (4.0 X 23 mm) Vision stent to RCA  . CORONARY STENT PLACEMENT  2010  . DILATION AND CURETTAGE OF UTERUS  1992 & 2004  . TONSILLECTOMY  1971  . TUBAL LIGATION  2005     Current Outpatient Medications  Medication Sig Dispense Refill  . amLODipine (NORVASC) 5 MG tablet Take 1 tablet (5 mg total) by mouth daily. 30 tablet 11  . aspirin 325 MG tablet Take 325 mg by mouth daily.      Marland Kitchen. atorvastatin (LIPITOR) 80 MG tablet Take 1 tablet (80 mg total) by mouth every evening. 30 tablet 6  . carvedilol (COREG) 12.5 MG tablet 2 (two) times daily with a meal.   1  . DULoxetine (CYMBALTA) 60 MG capsule TAKE ONE CAPSULE BY MOUTH DAILY    . FentaNYL 37.5 MCG/HR PT72 Apply 1 patch topically every other day.  0  . folic acid (FOLVITE) 400 MCG tablet Take 1,000 mcg by mouth daily.     . furosemide (LASIX) 20 MG tablet Take 20 mg by mouth 2 (two) times daily.     Marland Kitchen. gabapentin (NEURONTIN) 100 MG capsule Take 100 mg by mouth daily.  0  . glycopyrrolate (ROBINUL) 2 MG tablet Take 2 mg by mouth daily.    Marland Kitchen. HUMALOG 100 UNIT/ML injection Insulin Pump. Sliding scale    . magnesium gluconate (MAGONATE)  500 MG tablet Take 500 mg by mouth daily.    . nitroGLYCERIN (NITROSTAT) 0.4 MG SL tablet Place 1 tablet (0.4 mg total) under the tongue every 5 (five) minutes as needed. 25 tablet 1  . OLMESARTAN MEDOXOMIL PO Take 40 mg by mouth daily.    . Oxycodone HCl 10 MG TABS Take 1 tablet by mouth 3 (three) times daily.    . pantoprazole (PROTONIX) 40 MG tablet Take 40 mg by mouth 2 (two) times daily.      No current facility-administered medications for this visit.     Allergies:   Patient has no known allergies.    Social History:  The patient  reports that she has been smoking.  She has a 20.00 pack-year smoking history. she has never used smokeless tobacco. She reports that she does not drink alcohol or use drugs.   Family History:  The patient's family history  includes Arrhythmia in her father; Huntington's disease in her mother and unknown relative; Hyperlipidemia in her father; Hypertension in her father.    ROS:  Please see the history of present illness.   Otherwise, review of systems are positive for none.   All other systems are reviewed and negative.    PHYSICAL EXAM: VS:  There were no vitals taken for this visit. , BMI There is no height or weight on file to calculate BMI. GEN: Well nourished, well developed, in no acute distress  HEENT: normal  Neck: no JVD, carotid bruits, or masses Cardiac: RRR; no  rubs, or gallops,no edema . There is 1/ of 6 systolic ejection murmur in the aortic area and Respiratory:  clear to auscultation bilaterally, normal work of breathing GI: soft, nontender, nondistended, + BS MS: no deformity or atrophy  Skin: warm and dry, no rash Neuro:  Strength and sensation are intact Psych: euthymic mood, full affect Vascular: Dorsalis pedis is not palpable bilaterally.  Posterior tibial is +2 on the right side and +1 on the left side.  EKG:  EKG is ordered today. The ekg ordered today demonstrates normal sinus rhythm with no significant ST or T wave changes.   Recent Labs: No results found for requested labs within last 8760 hours.    Lipid Panel No results found for: CHOL, TRIG, HDL, CHOLHDL, VLDL, LDLCALC, LDLDIRECT    Wt Readings from Last 3 Encounters:  11/01/17 182 lb (82.6 kg)  04/12/17 179 lb 12.8 oz (81.6 kg)  03/30/16 177 lb (80.3 kg)      Other studies Reviewed: Additional studies/ records that were reviewed today include: Recent labs. Review of the above records demonstrates: CBC was unremarkable. Basic metabolic profile showed normal kidney function. Cholesterol was 165, triglyceride 78 and LDL was 80. Hemoglobin A1c was 8.   ASSESSMENT AND PLAN:  1.  Coronary artery disease involving native coronary arteries without angina: No recent chest pain.  EKG is normal.  Continue medical  therapy.  2.  Worsening dyspnea, leg edema and weight gain: Concerning for diastolic heart failure.  I requested an echocardiogram for evaluation.  Symptoms improved with increasing the dose of furosemide to 20 mg twice daily.  3. Essential hypertension: Blood pressure continues to be uncontrolled.  I elected to add amlodipine 5 mg once daily.  3. Hyperlipidemia: Continue high dose atorvastatin.   4. Tobacco use: I discussed with her the importance of smoking cessation.  5. Bilateral leg pain worse on the left side.  Pulses are diminished on the left side.  I requested lower  extremity arterial Doppler.  I suspect that her pain is likely multifactorial with neuropathic component.   Disposition:   FU with me in 3 months  Signed,  Lorine Bears, MD  01/31/2018 9:28 AM    [facility]

## 2018-02-01 ENCOUNTER — Encounter: Payer: Self-pay | Admitting: Cardiovascular Disease

## 2018-02-01 DIAGNOSIS — E1142 Type 2 diabetes mellitus with diabetic polyneuropathy: Secondary | ICD-10-CM | POA: Diagnosis not present

## 2018-02-01 DIAGNOSIS — Z794 Long term (current) use of insulin: Secondary | ICD-10-CM | POA: Diagnosis not present

## 2018-02-13 DIAGNOSIS — E1142 Type 2 diabetes mellitus with diabetic polyneuropathy: Secondary | ICD-10-CM | POA: Diagnosis not present

## 2018-02-13 DIAGNOSIS — G894 Chronic pain syndrome: Secondary | ICD-10-CM | POA: Diagnosis not present

## 2018-02-13 DIAGNOSIS — M5417 Radiculopathy, lumbosacral region: Secondary | ICD-10-CM | POA: Diagnosis not present

## 2018-02-13 DIAGNOSIS — M47817 Spondylosis without myelopathy or radiculopathy, lumbosacral region: Secondary | ICD-10-CM | POA: Diagnosis not present

## 2018-03-01 DIAGNOSIS — R2689 Other abnormalities of gait and mobility: Secondary | ICD-10-CM | POA: Diagnosis not present

## 2018-03-01 DIAGNOSIS — M545 Low back pain: Secondary | ICD-10-CM | POA: Diagnosis not present

## 2018-03-01 DIAGNOSIS — M4306 Spondylolysis, lumbar region: Secondary | ICD-10-CM | POA: Diagnosis not present

## 2018-03-01 DIAGNOSIS — M6281 Muscle weakness (generalized): Secondary | ICD-10-CM | POA: Diagnosis not present

## 2018-03-03 DIAGNOSIS — M4306 Spondylolysis, lumbar region: Secondary | ICD-10-CM | POA: Diagnosis not present

## 2018-03-03 DIAGNOSIS — M545 Low back pain: Secondary | ICD-10-CM | POA: Diagnosis not present

## 2018-03-03 DIAGNOSIS — M6281 Muscle weakness (generalized): Secondary | ICD-10-CM | POA: Diagnosis not present

## 2018-03-03 DIAGNOSIS — R2689 Other abnormalities of gait and mobility: Secondary | ICD-10-CM | POA: Diagnosis not present

## 2018-03-07 ENCOUNTER — Encounter: Payer: Self-pay | Admitting: Cardiovascular Disease

## 2018-03-07 ENCOUNTER — Ambulatory Visit (INDEPENDENT_AMBULATORY_CARE_PROVIDER_SITE_OTHER): Payer: BLUE CROSS/BLUE SHIELD | Admitting: Cardiovascular Disease

## 2018-03-07 VITALS — BP 142/68 | HR 62 | Ht <= 58 in | Wt 181.2 lb

## 2018-03-07 DIAGNOSIS — I10 Essential (primary) hypertension: Secondary | ICD-10-CM | POA: Diagnosis not present

## 2018-03-07 DIAGNOSIS — Z720 Tobacco use: Secondary | ICD-10-CM

## 2018-03-07 DIAGNOSIS — E785 Hyperlipidemia, unspecified: Secondary | ICD-10-CM | POA: Diagnosis not present

## 2018-03-07 DIAGNOSIS — I5032 Chronic diastolic (congestive) heart failure: Secondary | ICD-10-CM | POA: Diagnosis not present

## 2018-03-07 DIAGNOSIS — I2510 Atherosclerotic heart disease of native coronary artery without angina pectoris: Secondary | ICD-10-CM

## 2018-03-07 MED ORDER — NITROGLYCERIN 0.4 MG SL SUBL: 0.4000 mg | SUBLINGUAL_TABLET | SUBLINGUAL | 1 refills | Status: DC | PRN

## 2018-03-07 NOTE — Progress Notes (Signed)
Cardiology Office Note   Date:  03/08/2018   ID:  [name], DOB 09/16/1966, MRN [id number]  PCP:  Gus HeightJohnson, Andrea, PA-C  Cardiologist:   Lorine BearsMuhammad Adaira Centola, MD   No chief complaint on file.     History of Present Illness: [name] is a 52 y.o. female who presents for a followup visit regarding coronary artery disease. She is status post inferior myocardial infarction in July 2010. At that time she had an angioplasty and bare-metal stent placement to the right coronary artery.  Most recent stress test was in August of 2014  showed no evidence of ischemia with normal ejection fraction.  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 She is on longterm Fentanyl patch. Previous ABI in 2015 was normal. She is on insulin pump for diabetes. She was seen recently for worsening dyspnea, leg edema and weight gain in addition to uncontrolled blood pressure. She also reported worsening leg pain.  I proceeded with an echocardiogram which showed normal LV systolic function but there was evidence of diastolic dysfunction with elevated filling pressures.  Lower extremity arterial Doppler showed normal ABI bilaterally. Leg edema resolved with furosemide.  Yesterday, she had an episode of right-sided chest pain described as heartburn that felt like acid reflux.  It was followed by nausea, vomiting and diarrhea.  She felt better after that.  No recurrent symptoms.  Blood pressure has been more controlled.  Past Medical History:  Diagnosis Date  . CAD (coronary artery disease)    s/p MI & stenting  . DM2 (diabetes mellitus, type 2) (HCC)   . HLD (hyperlipidemia)   . HTN (hypertension)   . MI (myocardial infarction) (HCC)   . Sympathetic reflex dystrophy   . Tobacco abuse     Past Surgical History:  Procedure Laterality Date  . CARDIAC CATHETERIZATION  06/05/2009   Inferior MI. Severe 1 vessel CAD (RCA)  . CARPAL TUNNEL RELEASE  2002   left  . CHOLECYSTECTOMY   2006  . CORONARY ANGIOPLASTY WITH STENT PLACEMENT  06/05/2009   BMS (4.0 X 23 mm) Vision stent to RCA  . CORONARY STENT PLACEMENT  2010  . DILATION AND CURETTAGE OF UTERUS  1992 & 2004  . TONSILLECTOMY  1971  . TUBAL LIGATION  2005     Current Outpatient Medications  Medication Sig Dispense Refill  . amLODipine (NORVASC) 5 MG tablet Take 1 tablet (5 mg total) by mouth daily. 30 tablet 11  . aspirin 81 MG tablet Take 81 mg by mouth daily.     Marland Kitchen. atorvastatin (LIPITOR) 80 MG tablet Take 1 tablet (80 mg total) by mouth every evening. 30 tablet 6  . carvedilol (COREG) 12.5 MG tablet 2 (two) times daily with a meal.   1  . DULoxetine (CYMBALTA) 60 MG capsule TAKE ONE CAPSULE BY MOUTH DAILY    . FentaNYL 37.5 MCG/HR PT72 Apply 1 patch topically every other day.  0  . folic acid (FOLVITE) 400 MCG tablet Take 1,000 mcg by mouth daily.     . furosemide (LASIX) 20 MG tablet Take 20 mg by mouth 2 (two) times daily.     Marland Kitchen. glycopyrrolate (ROBINUL) 2 MG tablet Take 2 mg by mouth daily.    Marland Kitchen. HUMALOG 100 UNIT/ML injection Insulin Pump. Sliding scale    . magnesium gluconate (MAGONATE) 500 MG tablet Take 500 mg by mouth daily.    . nitroGLYCERIN (NITROSTAT) 0.4 MG SL tablet Place 1 tablet (0.4 mg  total) under the tongue every 5 (five) minutes as needed. 25 tablet 1  . OLMESARTAN MEDOXOMIL PO Take 40 mg by mouth daily.    . Oxycodone HCl 10 MG TABS Take 1 tablet by mouth 3 (three) times daily.    . pantoprazole (PROTONIX) 40 MG tablet Take 40 mg by mouth 2 (two) times daily.     Carlena Hurl 2.5 MG TABS tablet take 1 tablet (2.5 mg) by oral route 2 times per day  2  . gabapentin (NEURONTIN) 100 MG capsule Take 100 mg by mouth daily.  0   No current facility-administered medications for this visit.     Allergies:   Patient has no known allergies.    Social History:  The patient  reports that she has been smoking.  She has a 20.00 pack-year smoking history. She has never used smokeless tobacco. She  reports that she does not drink alcohol or use drugs.   Family History:  The patient's family history includes Arrhythmia in her father; Huntington's disease in her mother and unknown relative; Hyperlipidemia in her father; Hypertension in her father.    ROS:  Please see the history of present illness.   Otherwise, review of systems are positive for none.   All other systems are reviewed and negative.    PHYSICAL EXAM: VS:  BP (!) 142/68   Pulse 62   Ht 4\' 10"  (1.473 m)   Wt 181 lb 3.2 oz (82.2 kg)   SpO2 97%   BMI 37.87 kg/m  , BMI Body mass index is 37.87 kg/m. GEN: Well nourished, well developed, in no acute distress  HEENT: normal  Neck: no JVD, carotid bruits, or masses Cardiac: RRR; no  rubs, or gallops,no edema . There is 1/ of 6 systolic ejection murmur in the aortic area and Respiratory:  clear to auscultation bilaterally, normal work of breathing GI: soft, nontender, nondistended, + BS MS: no deformity or atrophy  Skin: warm and dry, no rash Neuro:  Strength and sensation are intact Psych: euthymic mood, full affect Vascular: Dorsalis pedis is not palpable bilaterally.  Posterior tibial is +2 on the right side and +1 on the left side.  EKG:  EKG is ordered today. The ekg ordered today demonstrates normal sinus rhythm with no significant ST or T wave changes.   Recent Labs: No results found for requested labs within last 8760 hours.    Lipid Panel No results found for: CHOL, TRIG, HDL, CHOLHDL, VLDL, LDLCALC, LDLDIRECT    Wt Readings from Last 3 Encounters:  03/07/18 181 lb 3.2 oz (82.2 kg)  11/01/17 182 lb (82.6 kg)  04/12/17 179 lb 12.8 oz (81.6 kg)      Other studies Reviewed: Additional studies/ records that were reviewed today include: Recent labs. Review of the above records demonstrates: CBC was unremarkable. Basic metabolic profile showed normal kidney function. Cholesterol was 165, triglyceride 78 and LDL was 80. Hemoglobin A1c was  8.   ASSESSMENT AND PLAN:  1.  Coronary artery disease involving native coronary arteries without angina: One episode of chest pain yesterday sounds atypical and likely GI in nature.  EKG today does not show any ischemic changes.  I discussed with her the possibility of proceeding with stress testing given no recent stress test.  However, it seems that she had significant side effects with Lexiscan during last test.  Thus, we will hold off for now and she will notify us if she has recurrent symptoms.  2.   Chronic  diastolic heart failure: She appears to be euvolemic on current dose of furosemide.  Symptoms improved with controlling blood pressure.  3. Essential hypertension: Amlodipine was added during last visit with improvement in blood pressure.  She is also on carvedilol and Benicar.  3. Hyperlipidemia: Continue high dose atorvastatin.  I reviewed most recent lipid profile in August which showed an LDL of 68.  4. Tobacco use: I discussed with her the importance of smoking cessation.  She is not ready to quit.    5. Bilateral leg pain worse on the left side.   Normal ABI with no evidence of peripheral arterial disease.   Disposition:   FU with me in 6 months  Signed,  Lorine Bears, MD  03/08/2018 9:32 AM    [facility]

## 2018-03-07 NOTE — Patient Instructions (Signed)

## 2018-03-13 DIAGNOSIS — M4306 Spondylolysis, lumbar region: Secondary | ICD-10-CM | POA: Diagnosis not present

## 2018-03-13 DIAGNOSIS — M6281 Muscle weakness (generalized): Secondary | ICD-10-CM | POA: Diagnosis not present

## 2018-03-13 DIAGNOSIS — R2689 Other abnormalities of gait and mobility: Secondary | ICD-10-CM | POA: Diagnosis not present

## 2018-03-13 DIAGNOSIS — M545 Low back pain: Secondary | ICD-10-CM | POA: Diagnosis not present

## 2018-03-16 DIAGNOSIS — M545 Low back pain: Secondary | ICD-10-CM | POA: Diagnosis not present

## 2018-03-16 DIAGNOSIS — M4306 Spondylolysis, lumbar region: Secondary | ICD-10-CM | POA: Diagnosis not present

## 2018-03-16 DIAGNOSIS — R2689 Other abnormalities of gait and mobility: Secondary | ICD-10-CM | POA: Diagnosis not present

## 2018-03-16 DIAGNOSIS — M6281 Muscle weakness (generalized): Secondary | ICD-10-CM | POA: Diagnosis not present

## 2018-03-21 DIAGNOSIS — R2689 Other abnormalities of gait and mobility: Secondary | ICD-10-CM | POA: Diagnosis not present

## 2018-03-21 DIAGNOSIS — M4306 Spondylolysis, lumbar region: Secondary | ICD-10-CM | POA: Diagnosis not present

## 2018-03-21 DIAGNOSIS — M6281 Muscle weakness (generalized): Secondary | ICD-10-CM | POA: Diagnosis not present

## 2018-03-21 DIAGNOSIS — M545 Low back pain: Secondary | ICD-10-CM | POA: Diagnosis not present

## 2018-04-17 DIAGNOSIS — M5417 Radiculopathy, lumbosacral region: Secondary | ICD-10-CM | POA: Diagnosis not present

## 2018-04-17 DIAGNOSIS — M47817 Spondylosis without myelopathy or radiculopathy, lumbosacral region: Secondary | ICD-10-CM | POA: Diagnosis not present

## 2018-04-17 DIAGNOSIS — G894 Chronic pain syndrome: Secondary | ICD-10-CM | POA: Diagnosis not present

## 2018-04-17 DIAGNOSIS — E1142 Type 2 diabetes mellitus with diabetic polyneuropathy: Secondary | ICD-10-CM | POA: Diagnosis not present

## 2018-04-25 DIAGNOSIS — I2510 Atherosclerotic heart disease of native coronary artery without angina pectoris: Secondary | ICD-10-CM | POA: Diagnosis not present

## 2018-04-25 DIAGNOSIS — E1142 Type 2 diabetes mellitus with diabetic polyneuropathy: Secondary | ICD-10-CM | POA: Diagnosis not present

## 2018-04-25 DIAGNOSIS — E782 Mixed hyperlipidemia: Secondary | ICD-10-CM | POA: Diagnosis not present

## 2018-04-25 DIAGNOSIS — F321 Major depressive disorder, single episode, moderate: Secondary | ICD-10-CM | POA: Diagnosis not present

## 2018-04-25 DIAGNOSIS — Z794 Long term (current) use of insulin: Secondary | ICD-10-CM | POA: Diagnosis not present

## 2018-04-25 DIAGNOSIS — K219 Gastro-esophageal reflux disease without esophagitis: Secondary | ICD-10-CM | POA: Diagnosis not present

## 2018-04-25 DIAGNOSIS — Z6837 Body mass index (BMI) 37.0-37.9, adult: Secondary | ICD-10-CM | POA: Diagnosis not present

## 2018-04-25 DIAGNOSIS — Z4681 Encounter for fitting and adjustment of insulin pump: Secondary | ICD-10-CM | POA: Diagnosis not present

## 2018-04-25 DIAGNOSIS — I119 Hypertensive heart disease without heart failure: Secondary | ICD-10-CM | POA: Diagnosis not present

## 2018-04-25 DIAGNOSIS — I252 Old myocardial infarction: Secondary | ICD-10-CM | POA: Diagnosis not present

## 2018-05-30 DIAGNOSIS — Z1231 Encounter for screening mammogram for malignant neoplasm of breast: Secondary | ICD-10-CM | POA: Diagnosis not present

## 2018-06-13 DIAGNOSIS — G894 Chronic pain syndrome: Secondary | ICD-10-CM | POA: Diagnosis not present

## 2018-06-13 DIAGNOSIS — E1142 Type 2 diabetes mellitus with diabetic polyneuropathy: Secondary | ICD-10-CM | POA: Diagnosis not present

## 2018-06-13 DIAGNOSIS — M5417 Radiculopathy, lumbosacral region: Secondary | ICD-10-CM | POA: Diagnosis not present

## 2018-06-13 DIAGNOSIS — M47817 Spondylosis without myelopathy or radiculopathy, lumbosacral region: Secondary | ICD-10-CM | POA: Diagnosis not present

## 2018-08-02 ENCOUNTER — Encounter: Payer: Self-pay | Admitting: Cardiovascular Disease

## 2018-08-02 DIAGNOSIS — I119 Hypertensive heart disease without heart failure: Secondary | ICD-10-CM | POA: Diagnosis not present

## 2018-08-02 DIAGNOSIS — I2510 Atherosclerotic heart disease of native coronary artery without angina pectoris: Secondary | ICD-10-CM | POA: Diagnosis not present

## 2018-08-02 DIAGNOSIS — Z4681 Encounter for fitting and adjustment of insulin pump: Secondary | ICD-10-CM | POA: Diagnosis not present

## 2018-08-02 DIAGNOSIS — Z794 Long term (current) use of insulin: Secondary | ICD-10-CM | POA: Diagnosis not present

## 2018-08-02 DIAGNOSIS — I252 Old myocardial infarction: Secondary | ICD-10-CM | POA: Diagnosis not present

## 2018-08-02 DIAGNOSIS — F321 Major depressive disorder, single episode, moderate: Secondary | ICD-10-CM | POA: Diagnosis not present

## 2018-08-02 DIAGNOSIS — E782 Mixed hyperlipidemia: Secondary | ICD-10-CM | POA: Diagnosis not present

## 2018-08-02 DIAGNOSIS — E1142 Type 2 diabetes mellitus with diabetic polyneuropathy: Secondary | ICD-10-CM | POA: Diagnosis not present

## 2018-08-02 DIAGNOSIS — K219 Gastro-esophageal reflux disease without esophagitis: Secondary | ICD-10-CM | POA: Diagnosis not present

## 2018-08-21 DIAGNOSIS — J0100 Acute maxillary sinusitis, unspecified: Secondary | ICD-10-CM | POA: Diagnosis not present

## 2018-08-22 DIAGNOSIS — G894 Chronic pain syndrome: Secondary | ICD-10-CM | POA: Diagnosis not present

## 2018-08-22 DIAGNOSIS — E1142 Type 2 diabetes mellitus with diabetic polyneuropathy: Secondary | ICD-10-CM | POA: Diagnosis not present

## 2018-08-22 DIAGNOSIS — Z79891 Long term (current) use of opiate analgesic: Secondary | ICD-10-CM | POA: Diagnosis not present

## 2018-08-22 DIAGNOSIS — M47817 Spondylosis without myelopathy or radiculopathy, lumbosacral region: Secondary | ICD-10-CM | POA: Diagnosis not present

## 2018-08-22 DIAGNOSIS — M5417 Radiculopathy, lumbosacral region: Secondary | ICD-10-CM | POA: Diagnosis not present

## 2018-10-08 DIAGNOSIS — H00022 Hordeolum internum right lower eyelid: Secondary | ICD-10-CM | POA: Diagnosis not present

## 2018-10-23 ENCOUNTER — Other Ambulatory Visit: Payer: Self-pay | Admitting: Cardiovascular Disease

## 2018-10-23 NOTE — Telephone Encounter (Signed)
Refill Request.  

## 2018-10-24 ENCOUNTER — Telehealth: Payer: Self-pay

## 2018-10-24 MED ORDER — AMLODIPINE BESYLATE 5 MG PO TABS: 5.0000 mg | ORAL_TABLET | Freq: Every day | ORAL | 3 refills | Status: DC

## 2018-10-24 NOTE — Telephone Encounter (Signed)
Amlodipine 5 mg #30 sent to [facility]

## 2018-10-25 ENCOUNTER — Telehealth: Payer: Self-pay | Admitting: Internal Medicine

## 2018-10-25 DIAGNOSIS — G894 Chronic pain syndrome: Secondary | ICD-10-CM | POA: Diagnosis not present

## 2018-10-25 DIAGNOSIS — E1142 Type 2 diabetes mellitus with diabetic polyneuropathy: Secondary | ICD-10-CM | POA: Diagnosis not present

## 2018-10-25 DIAGNOSIS — M47817 Spondylosis without myelopathy or radiculopathy, lumbosacral region: Secondary | ICD-10-CM | POA: Diagnosis not present

## 2018-10-25 DIAGNOSIS — M5417 Radiculopathy, lumbosacral region: Secondary | ICD-10-CM | POA: Diagnosis not present

## 2018-10-25 NOTE — Telephone Encounter (Signed)
Spoke with patient and she was not sure why doppler scheduled, will forward to Roderick PeeLisa R RN with Dr Kirke CorinArida to make sure needed. Did schedule follow up visit.

## 2018-10-25 NOTE — Telephone Encounter (Signed)
Cancel LEA.

## 2018-10-25 NOTE — Telephone Encounter (Signed)
New Message           Patient has questions about her appt on 12/22/2018? Pls call and advise.

## 2018-10-25 NOTE — Telephone Encounter (Signed)
Returned the call to the patient. She stated that her bilateral leg pain is not any worse than it has been for the past 14 years. She has an LEA scheduled for January 17th that she would like to cancel since her last ABI was normal.   She has a follow up with Dr. Kirke CorinArida on 12/19/18.

## 2018-11-01 DIAGNOSIS — E1142 Type 2 diabetes mellitus with diabetic polyneuropathy: Secondary | ICD-10-CM | POA: Diagnosis not present

## 2018-11-06 DIAGNOSIS — E782 Mixed hyperlipidemia: Secondary | ICD-10-CM | POA: Diagnosis not present

## 2018-11-06 DIAGNOSIS — J208 Acute bronchitis due to other specified organisms: Secondary | ICD-10-CM | POA: Diagnosis not present

## 2018-11-06 DIAGNOSIS — Z4681 Encounter for fitting and adjustment of insulin pump: Secondary | ICD-10-CM | POA: Diagnosis not present

## 2018-11-06 DIAGNOSIS — K219 Gastro-esophageal reflux disease without esophagitis: Secondary | ICD-10-CM | POA: Diagnosis not present

## 2018-11-06 DIAGNOSIS — E1142 Type 2 diabetes mellitus with diabetic polyneuropathy: Secondary | ICD-10-CM | POA: Diagnosis not present

## 2018-11-06 DIAGNOSIS — I2510 Atherosclerotic heart disease of native coronary artery without angina pectoris: Secondary | ICD-10-CM | POA: Diagnosis not present

## 2018-11-06 DIAGNOSIS — Z794 Long term (current) use of insulin: Secondary | ICD-10-CM | POA: Diagnosis not present

## 2018-11-06 DIAGNOSIS — F321 Major depressive disorder, single episode, moderate: Secondary | ICD-10-CM | POA: Diagnosis not present

## 2018-11-06 DIAGNOSIS — I252 Old myocardial infarction: Secondary | ICD-10-CM | POA: Diagnosis not present

## 2018-11-06 DIAGNOSIS — I119 Hypertensive heart disease without heart failure: Secondary | ICD-10-CM | POA: Diagnosis not present

## 2018-12-19 ENCOUNTER — Ambulatory Visit: Payer: BLUE CROSS/BLUE SHIELD | Admitting: Cardiovascular Disease

## 2018-12-19 DIAGNOSIS — M5417 Radiculopathy, lumbosacral region: Secondary | ICD-10-CM | POA: Diagnosis not present

## 2018-12-19 DIAGNOSIS — G894 Chronic pain syndrome: Secondary | ICD-10-CM | POA: Diagnosis not present

## 2018-12-19 DIAGNOSIS — M47817 Spondylosis without myelopathy or radiculopathy, lumbosacral region: Secondary | ICD-10-CM | POA: Diagnosis not present

## 2018-12-19 DIAGNOSIS — E1142 Type 2 diabetes mellitus with diabetic polyneuropathy: Secondary | ICD-10-CM | POA: Diagnosis not present

## 2018-12-22 ENCOUNTER — Encounter (HOSPITAL_COMMUNITY): Payer: BLUE CROSS/BLUE SHIELD

## 2018-12-22 DIAGNOSIS — H524 Presbyopia: Secondary | ICD-10-CM | POA: Diagnosis not present

## 2018-12-22 DIAGNOSIS — H5203 Hypermetropia, bilateral: Secondary | ICD-10-CM | POA: Diagnosis not present

## 2018-12-22 DIAGNOSIS — H52223 Regular astigmatism, bilateral: Secondary | ICD-10-CM | POA: Diagnosis not present

## 2019-01-09 ENCOUNTER — Ambulatory Visit (INDEPENDENT_AMBULATORY_CARE_PROVIDER_SITE_OTHER): Payer: BLUE CROSS/BLUE SHIELD | Admitting: Cardiovascular Disease

## 2019-01-09 ENCOUNTER — Encounter: Payer: Self-pay | Admitting: Cardiovascular Disease

## 2019-01-09 VITALS — BP 158/60 | HR 59 | Ht <= 58 in | Wt 182.6 lb

## 2019-01-09 DIAGNOSIS — I2510 Atherosclerotic heart disease of native coronary artery without angina pectoris: Secondary | ICD-10-CM | POA: Diagnosis not present

## 2019-01-09 DIAGNOSIS — Z720 Tobacco use: Secondary | ICD-10-CM

## 2019-01-09 DIAGNOSIS — I10 Essential (primary) hypertension: Secondary | ICD-10-CM | POA: Diagnosis not present

## 2019-01-09 DIAGNOSIS — E785 Hyperlipidemia, unspecified: Secondary | ICD-10-CM

## 2019-01-09 DIAGNOSIS — I5032 Chronic diastolic (congestive) heart failure: Secondary | ICD-10-CM | POA: Diagnosis not present

## 2019-01-09 NOTE — Progress Notes (Signed)
Cardiology Office Note   Date:  01/09/2019   ID:  [name], DOB [birth year], MRN [id number]  PCP:  Gus Height, PA-C  Cardiologist:   Lorine Bears, MD   Chief Complaint  Patient presents with  . Follow-up    pt denied chest pain      History of Present Illness: [name] is a 53 y.o. female who presents for a followup visit regarding coronary artery disease. She is status post inferior myocardial infarction in July 2010. At that time she had an angioplasty and bare-metal stent placement to the right coronary artery.  Most recent stress test was in August of 2014  showed no evidence of ischemia with normal ejection fraction.  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 She is on longterm Fentanyl patch. She is on insulin pump for diabetes. Echocardiogram in December 2018 showed normal LV systolic function but there was evidence of diastolic dysfunction with elevated filling pressures.  Lower extremity arterial Doppler in 12/2017 showed normal ABI bilaterally.  She has been doing well with no recent chest pain or worsening dyspnea.  She reports stable bilateral leg pain.  She was started on small dose Xarelto by her primary care physician.  She continues to smoke.  Past Medical History:  Diagnosis Date  . CAD (coronary artery disease)    s/p MI & stenting  . DM2 (diabetes mellitus, type 2) (HCC)   . HLD (hyperlipidemia)   . HTN (hypertension)   . MI (myocardial infarction) (HCC)   . Sympathetic reflex dystrophy   . Tobacco abuse     Past Surgical History:  Procedure Laterality Date  . CARDIAC CATHETERIZATION  06/05/2009   Inferior MI. Severe 1 vessel CAD (RCA)  . CARPAL TUNNEL RELEASE  2002   left  . CHOLECYSTECTOMY  2006  . CORONARY ANGIOPLASTY WITH STENT PLACEMENT  06/05/2009   BMS (4.0 X 23 mm) Vision stent to RCA  . CORONARY STENT PLACEMENT  2010  . DILATION AND CURETTAGE OF UTERUS  1992 & 2004  . TONSILLECTOMY  1971  .  TUBAL LIGATION  2005     Current Outpatient Medications  Medication Sig Dispense Refill  . amLODipine (NORVASC) 5 MG tablet Take 1 tablet (5 mg total) by mouth daily. 30 tablet 3  . aspirin 81 MG tablet Take 81 mg by mouth daily.     Marland Kitchen atorvastatin (LIPITOR) 80 MG tablet Take 1 tablet (80 mg total) by mouth every evening. 30 tablet 6  . carvedilol (COREG) 12.5 MG tablet 2 (two) times daily with a meal.   1  . DULoxetine (CYMBALTA) 60 MG capsule TAKE ONE CAPSULE BY MOUTH DAILY    . fentaNYL (DURAGESIC) 25 MCG/HR Apply 1 patch to skin every 48 hours    . folic acid (FOLVITE) 1 MG tablet Take 1 mg by mouth daily.    . furosemide (LASIX) 20 MG tablet Take 20 mg by mouth daily.    Marland Kitchen gabapentin (NEURONTIN) 100 MG capsule Take 100 mg by mouth daily.  0  . glycopyrrolate (ROBINUL) 2 MG tablet Take 2 mg by mouth as needed.     Marland Kitchen HUMALOG 100 UNIT/ML injection Insulin Pump. Sliding scale    . magnesium gluconate (MAGONATE) 500 MG tablet Take 500 mg by mouth daily.    . nitroGLYCERIN (NITROSTAT) 0.4 MG SL tablet Place 1 tablet (0.4 mg total) under the tongue every 5 (five) minutes as needed. 25 tablet 1  . NOVOLOG  100 UNIT/ML injection Inject 120-150 units via insulin pump daily    . OLMESARTAN MEDOXOMIL PO Take 40 mg by mouth daily.    . Oxycodone HCl 10 MG TABS Take 1 tablet by mouth 3 (three) times daily.    . pantoprazole (PROTONIX) 40 MG tablet Take 40 mg by mouth 2 (two) times daily.     Carlena Hurl 2.5 MG TABS tablet take 1 tablet (2.5 mg) by oral route 2 times per day  2   No current facility-administered medications for this visit.     Allergies:   Patient has no known allergies.    Social History:  The patient  reports that she has been smoking. She has a 20.00 pack-year smoking history. She has never used smokeless tobacco. She reports that she does not drink alcohol or use drugs.   Family History:  The patient's family history includes Arrhythmia in her father; Huntington's disease in  her mother and unknown relative; Hyperlipidemia in her father; Hypertension in her father.    ROS:  Please see the history of present illness.   Otherwise, review of systems are positive for none.   All other systems are reviewed and negative.    PHYSICAL EXAM: VS:  BP (!) 158/60   Pulse (!) 59   Ht 4\' 10"  (1.473 m)   Wt 182 lb 9.6 oz (82.8 kg)   BMI 38.16 kg/m  , BMI Body mass index is 38.16 kg/m. GEN: Well nourished, well developed, in no acute distress  HEENT: normal  Neck: no JVD, carotid bruits, or masses Cardiac: RRR; no  rubs, or gallops,no edema . There is 1/ of 6 systolic ejection murmur in the aortic area and Respiratory:  clear to auscultation bilaterally, normal work of breathing GI: soft, nontender, nondistended, + BS MS: no deformity or atrophy  Skin: warm and dry, no rash Neuro:  Strength and sensation are intact Psych: euthymic mood, full affect   EKG:  EKG is ordered today. The ekg ordered today demonstrates sinus bradycardia with no significant ST or T wave changes.   Recent Labs: No results found for requested labs within last 8760 hours.    Lipid Panel No results found for: CHOL, TRIG, HDL, CHOLHDL, VLDL, LDLCALC, LDLDIRECT    Wt Readings from Last 3 Encounters:  01/09/19 182 lb 9.6 oz (82.8 kg)  03/07/18 181 lb 3.2 oz (82.2 kg)  11/01/17 182 lb (82.6 kg)      Other studies Reviewed:   ASSESSMENT AND PLAN:  1.  Coronary artery disease involving native coronary arteries without angina: She is doing very well with no anginal symptoms.  EKG is unremarkable.  Continue medical therapy.  2.   Chronic diastolic heart failure: She appears to be euvolemic on current dose of furosemide.  Symptoms improved with controlling blood pressure.  3. Essential hypertension: Blood pressure is reasonably controlled on amlodipine, carvedilol and Benicar.  3. Hyperlipidemia: Continue high dose atorvastatin.  I reviewed most recent lipid profile in August which  showed an LDL of 68.   4. Tobacco use: I discussed with her the importance of smoking cessation.  She is not ready to quit.     Disposition:   FU with me in 9 months  Signed,  Lorine Bears, MD  01/09/2019 11:54 AM    [facility]

## 2019-01-09 NOTE — Patient Instructions (Signed)
Medication Instructions:  No changes If you need a refill on your cardiac medications before your next appointment, please call your pharmacy.   Lab work: None ordered  Testing/Procedures: None ordered  Follow-Up: At [facility], you and your health needs are our priority.  As part of our continuing mission to provide you with exceptional heart care, we have created designated Provider Care Teams.  These Care Teams include your primary Cardiologist (physician) and Advanced Practice Providers (APPs -  Physician Assistants and Nurse Practitioners) who all work together to provide you with the care you need, when you need it. You will need a follow up appointment in 9 months.  Please call our office 2 months in advance to schedule this appointment.  You may see Dr. Arida or one of the following Advanced Practice Providers on your designated Care Team:   Luke Kilroy, PA-C Krista Kroeger, PA-C . Callie Goodrich, PA-C   

## 2019-02-13 DIAGNOSIS — Z4681 Encounter for fitting and adjustment of insulin pump: Secondary | ICD-10-CM | POA: Diagnosis not present

## 2019-02-13 DIAGNOSIS — E1142 Type 2 diabetes mellitus with diabetic polyneuropathy: Secondary | ICD-10-CM | POA: Diagnosis not present

## 2019-02-13 DIAGNOSIS — F321 Major depressive disorder, single episode, moderate: Secondary | ICD-10-CM | POA: Diagnosis not present

## 2019-02-13 DIAGNOSIS — I252 Old myocardial infarction: Secondary | ICD-10-CM | POA: Diagnosis not present

## 2019-02-13 DIAGNOSIS — I2510 Atherosclerotic heart disease of native coronary artery without angina pectoris: Secondary | ICD-10-CM | POA: Diagnosis not present

## 2019-02-13 DIAGNOSIS — K219 Gastro-esophageal reflux disease without esophagitis: Secondary | ICD-10-CM | POA: Diagnosis not present

## 2019-02-13 DIAGNOSIS — I119 Hypertensive heart disease without heart failure: Secondary | ICD-10-CM | POA: Diagnosis not present

## 2019-02-13 DIAGNOSIS — E782 Mixed hyperlipidemia: Secondary | ICD-10-CM | POA: Diagnosis not present

## 2019-02-13 DIAGNOSIS — Z794 Long term (current) use of insulin: Secondary | ICD-10-CM | POA: Diagnosis not present

## 2019-02-20 DIAGNOSIS — E1142 Type 2 diabetes mellitus with diabetic polyneuropathy: Secondary | ICD-10-CM | POA: Diagnosis not present

## 2019-02-20 DIAGNOSIS — G894 Chronic pain syndrome: Secondary | ICD-10-CM | POA: Diagnosis not present

## 2019-02-20 DIAGNOSIS — M5417 Radiculopathy, lumbosacral region: Secondary | ICD-10-CM | POA: Diagnosis not present

## 2019-02-20 DIAGNOSIS — M47817 Spondylosis without myelopathy or radiculopathy, lumbosacral region: Secondary | ICD-10-CM | POA: Diagnosis not present

## 2019-04-06 DIAGNOSIS — E1142 Type 2 diabetes mellitus with diabetic polyneuropathy: Secondary | ICD-10-CM | POA: Diagnosis not present

## 2019-04-06 DIAGNOSIS — E1140 Type 2 diabetes mellitus with diabetic neuropathy, unspecified: Secondary | ICD-10-CM | POA: Diagnosis not present

## 2019-04-24 DIAGNOSIS — E1142 Type 2 diabetes mellitus with diabetic polyneuropathy: Secondary | ICD-10-CM | POA: Diagnosis not present

## 2019-04-24 DIAGNOSIS — M5417 Radiculopathy, lumbosacral region: Secondary | ICD-10-CM | POA: Diagnosis not present

## 2019-04-24 DIAGNOSIS — G894 Chronic pain syndrome: Secondary | ICD-10-CM | POA: Diagnosis not present

## 2019-04-24 DIAGNOSIS — M47812 Spondylosis without myelopathy or radiculopathy, cervical region: Secondary | ICD-10-CM | POA: Diagnosis not present

## 2019-04-24 DIAGNOSIS — M21372 Foot drop, left foot: Secondary | ICD-10-CM | POA: Diagnosis not present

## 2019-04-24 DIAGNOSIS — Z79891 Long term (current) use of opiate analgesic: Secondary | ICD-10-CM | POA: Diagnosis not present

## 2019-04-24 DIAGNOSIS — M47817 Spondylosis without myelopathy or radiculopathy, lumbosacral region: Secondary | ICD-10-CM | POA: Diagnosis not present

## 2019-04-24 DIAGNOSIS — G5601 Carpal tunnel syndrome, right upper limb: Secondary | ICD-10-CM | POA: Diagnosis not present

## 2019-05-23 DIAGNOSIS — K219 Gastro-esophageal reflux disease without esophagitis: Secondary | ICD-10-CM | POA: Diagnosis not present

## 2019-05-23 DIAGNOSIS — I252 Old myocardial infarction: Secondary | ICD-10-CM | POA: Diagnosis not present

## 2019-05-23 DIAGNOSIS — M47816 Spondylosis without myelopathy or radiculopathy, lumbar region: Secondary | ICD-10-CM | POA: Diagnosis not present

## 2019-05-23 DIAGNOSIS — E782 Mixed hyperlipidemia: Secondary | ICD-10-CM | POA: Diagnosis not present

## 2019-05-23 DIAGNOSIS — M545 Low back pain: Secondary | ICD-10-CM | POA: Diagnosis not present

## 2019-05-23 DIAGNOSIS — E1142 Type 2 diabetes mellitus with diabetic polyneuropathy: Secondary | ICD-10-CM | POA: Diagnosis not present

## 2019-05-23 DIAGNOSIS — Z4681 Encounter for fitting and adjustment of insulin pump: Secondary | ICD-10-CM | POA: Diagnosis not present

## 2019-05-23 DIAGNOSIS — Z794 Long term (current) use of insulin: Secondary | ICD-10-CM | POA: Diagnosis not present

## 2019-05-23 DIAGNOSIS — I2510 Atherosclerotic heart disease of native coronary artery without angina pectoris: Secondary | ICD-10-CM | POA: Diagnosis not present

## 2019-05-23 DIAGNOSIS — F321 Major depressive disorder, single episode, moderate: Secondary | ICD-10-CM | POA: Diagnosis not present

## 2019-05-23 DIAGNOSIS — I119 Hypertensive heart disease without heart failure: Secondary | ICD-10-CM | POA: Diagnosis not present

## 2019-06-25 DIAGNOSIS — G894 Chronic pain syndrome: Secondary | ICD-10-CM | POA: Diagnosis not present

## 2019-06-25 DIAGNOSIS — E1142 Type 2 diabetes mellitus with diabetic polyneuropathy: Secondary | ICD-10-CM | POA: Diagnosis not present

## 2019-06-25 DIAGNOSIS — M5417 Radiculopathy, lumbosacral region: Secondary | ICD-10-CM | POA: Diagnosis not present

## 2019-06-25 DIAGNOSIS — M47817 Spondylosis without myelopathy or radiculopathy, lumbosacral region: Secondary | ICD-10-CM | POA: Diagnosis not present

## 2019-08-21 DIAGNOSIS — Z1231 Encounter for screening mammogram for malignant neoplasm of breast: Secondary | ICD-10-CM | POA: Diagnosis not present

## 2019-08-22 ENCOUNTER — Other Ambulatory Visit: Payer: Self-pay | Admitting: Cardiovascular Disease

## 2019-08-22 NOTE — Telephone Encounter (Signed)
Please review for refill, Thanks !  

## 2019-09-06 DIAGNOSIS — E1142 Type 2 diabetes mellitus with diabetic polyneuropathy: Secondary | ICD-10-CM | POA: Diagnosis not present

## 2019-09-06 DIAGNOSIS — Z794 Long term (current) use of insulin: Secondary | ICD-10-CM | POA: Diagnosis not present

## 2019-09-06 DIAGNOSIS — Z23 Encounter for immunization: Secondary | ICD-10-CM | POA: Diagnosis not present

## 2019-09-06 DIAGNOSIS — I252 Old myocardial infarction: Secondary | ICD-10-CM | POA: Diagnosis not present

## 2019-09-06 DIAGNOSIS — F321 Major depressive disorder, single episode, moderate: Secondary | ICD-10-CM | POA: Diagnosis not present

## 2019-09-06 DIAGNOSIS — I119 Hypertensive heart disease without heart failure: Secondary | ICD-10-CM | POA: Diagnosis not present

## 2019-09-06 DIAGNOSIS — E782 Mixed hyperlipidemia: Secondary | ICD-10-CM | POA: Diagnosis not present

## 2019-09-06 DIAGNOSIS — I2510 Atherosclerotic heart disease of native coronary artery without angina pectoris: Secondary | ICD-10-CM | POA: Diagnosis not present

## 2019-09-06 DIAGNOSIS — K219 Gastro-esophageal reflux disease without esophagitis: Secondary | ICD-10-CM | POA: Diagnosis not present

## 2019-09-06 DIAGNOSIS — Z4681 Encounter for fitting and adjustment of insulin pump: Secondary | ICD-10-CM | POA: Diagnosis not present

## 2019-10-01 DIAGNOSIS — E1142 Type 2 diabetes mellitus with diabetic polyneuropathy: Secondary | ICD-10-CM | POA: Diagnosis not present

## 2019-10-18 DIAGNOSIS — E1142 Type 2 diabetes mellitus with diabetic polyneuropathy: Secondary | ICD-10-CM | POA: Diagnosis not present

## 2019-10-18 DIAGNOSIS — M5417 Radiculopathy, lumbosacral region: Secondary | ICD-10-CM | POA: Diagnosis not present

## 2019-10-18 DIAGNOSIS — Z79891 Long term (current) use of opiate analgesic: Secondary | ICD-10-CM | POA: Diagnosis not present

## 2019-10-18 DIAGNOSIS — G894 Chronic pain syndrome: Secondary | ICD-10-CM | POA: Diagnosis not present

## 2019-10-18 DIAGNOSIS — M47817 Spondylosis without myelopathy or radiculopathy, lumbosacral region: Secondary | ICD-10-CM | POA: Diagnosis not present

## 2019-11-04 DIAGNOSIS — Z1211 Encounter for screening for malignant neoplasm of colon: Secondary | ICD-10-CM | POA: Diagnosis not present

## 2019-12-10 DIAGNOSIS — E1169 Type 2 diabetes mellitus with other specified complication: Secondary | ICD-10-CM | POA: Diagnosis not present

## 2019-12-10 DIAGNOSIS — E782 Mixed hyperlipidemia: Secondary | ICD-10-CM | POA: Diagnosis not present

## 2019-12-10 DIAGNOSIS — I10 Essential (primary) hypertension: Secondary | ICD-10-CM | POA: Diagnosis not present

## 2019-12-13 DIAGNOSIS — G894 Chronic pain syndrome: Secondary | ICD-10-CM | POA: Diagnosis not present

## 2019-12-13 DIAGNOSIS — M47817 Spondylosis without myelopathy or radiculopathy, lumbosacral region: Secondary | ICD-10-CM | POA: Diagnosis not present

## 2019-12-13 DIAGNOSIS — E1142 Type 2 diabetes mellitus with diabetic polyneuropathy: Secondary | ICD-10-CM | POA: Diagnosis not present

## 2019-12-13 DIAGNOSIS — M5417 Radiculopathy, lumbosacral region: Secondary | ICD-10-CM | POA: Diagnosis not present

## 2020-01-01 ENCOUNTER — Ambulatory Visit: Payer: BLUE CROSS/BLUE SHIELD | Admitting: Cardiovascular Disease

## 2020-01-01 ENCOUNTER — Ambulatory Visit (INDEPENDENT_AMBULATORY_CARE_PROVIDER_SITE_OTHER): Payer: BC Managed Care – PPO | Admitting: Cardiovascular Disease

## 2020-01-01 ENCOUNTER — Encounter: Payer: Self-pay | Admitting: Cardiovascular Disease

## 2020-01-01 ENCOUNTER — Other Ambulatory Visit: Payer: Self-pay

## 2020-01-01 VITALS — BP 150/62 | HR 57 | Ht <= 58 in | Wt 182.0 lb

## 2020-01-01 DIAGNOSIS — I2510 Atherosclerotic heart disease of native coronary artery without angina pectoris: Secondary | ICD-10-CM

## 2020-01-01 DIAGNOSIS — E785 Hyperlipidemia, unspecified: Secondary | ICD-10-CM | POA: Diagnosis not present

## 2020-01-01 DIAGNOSIS — Z720 Tobacco use: Secondary | ICD-10-CM

## 2020-01-01 DIAGNOSIS — I5032 Chronic diastolic (congestive) heart failure: Secondary | ICD-10-CM

## 2020-01-01 DIAGNOSIS — I10 Essential (primary) hypertension: Secondary | ICD-10-CM

## 2020-01-01 NOTE — Progress Notes (Signed)
Cardiology Office Note   Date:  01/01/2020   ID:  Christine DREIBELBIS, DOB 11-03-1966, MRN [id number]  PCP:  Patient, No Pcp Per  Cardiologist:   Lorine Bears, MD   No chief complaint on file.     History of Present Illness: [name] is a 54 y.o. female who presents for a followup visit regarding coronary artery disease. She is status post inferior myocardial infarction in July 2010. At that time she had an angioplasty and bare-metal stent placement to the right coronary artery.  Most recent stress test was in August of 2014  showed no evidence of ischemia with normal ejection fraction.  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 She is on longterm Fentanyl patch. She is on insulin pump for diabetes. Echocardiogram in December 2018 showed normal LV systolic function but there was evidence of diastolic dysfunction with elevated filling pressures.  Lower extremity arterial Doppler in 12/2017 showed normal ABI bilaterally.  She has been doing well with no recent chest pain, shortness of breath or palpitations.  She takes her medications regularly. She continues to take small dose Xarelto which was prescribed by her primary care physician.  Past Medical History:  Diagnosis Date  . CAD (coronary artery disease)    s/p MI & stenting  . DM2 (diabetes mellitus, type 2) (HCC)   . HLD (hyperlipidemia)   . HTN (hypertension)   . MI (myocardial infarction) (HCC)   . Sympathetic reflex dystrophy   . Tobacco abuse     Past Surgical History:  Procedure Laterality Date  . CARDIAC CATHETERIZATION  06/05/2009   Inferior MI. Severe 1 vessel CAD (RCA)  . CARPAL TUNNEL RELEASE  2002   left  . CHOLECYSTECTOMY  2006  . CORONARY ANGIOPLASTY WITH STENT PLACEMENT  06/05/2009   BMS (4.0 X 23 mm) Vision stent to RCA  . CORONARY STENT PLACEMENT  2010  . DILATION AND CURETTAGE OF UTERUS  1992 & 2004  . TONSILLECTOMY  1971  . TUBAL LIGATION  2005     Current  Outpatient Medications  Medication Sig Dispense Refill  . amLODipine (NORVASC) 5 MG tablet Take 1 tablet (5 mg total) by mouth daily. 30 tablet 11  . aspirin 81 MG tablet Take 81 mg by mouth daily.     Marland Kitchen atorvastatin (LIPITOR) 80 MG tablet Take 1 tablet (80 mg total) by mouth every evening. 30 tablet 6  . carvedilol (COREG) 12.5 MG tablet 2 (two) times daily with a meal.   1  . DULoxetine (CYMBALTA) 60 MG capsule TAKE ONE CAPSULE BY MOUTH DAILY    . FARXIGA 10 MG TABS tablet Take 10 mg by mouth daily.    . fentaNYL (DURAGESIC) 25 MCG/HR Apply 1 patch to skin every 48 hours    . folic acid (FOLVITE) 1 MG tablet Take 1 mg by mouth daily.    . furosemide (LASIX) 20 MG tablet Take 20 mg by mouth daily.    Marland Kitchen gabapentin (NEURONTIN) 100 MG capsule Take 100 mg by mouth daily.  0  . glycopyrrolate (ROBINUL) 2 MG tablet Take 2 mg by mouth as needed.     Marland Kitchen HUMALOG 100 UNIT/ML injection Insulin Pump. Sliding scale    . magnesium gluconate (MAGONATE) 500 MG tablet Take 500 mg by mouth daily.    . nitroGLYCERIN (NITROSTAT) 0.4 MG SL tablet Place 1 tablet (0.4 mg total) under the tongue every 5 (five) minutes as needed. 25 tablet 1  .  NOVOLOG 100 UNIT/ML injection Inject 120-150 units via insulin pump daily    . OLMESARTAN MEDOXOMIL PO Take 40 mg by mouth daily.    . Oxycodone HCl 10 MG TABS Take 1 tablet by mouth 3 (three) times daily.    . pantoprazole (PROTONIX) 40 MG tablet Take 40 mg by mouth 2 (two) times daily.     Alveda Reasons 2.5 MG TABS tablet take 1 tablet (2.5 mg) by oral route 2 times per day  2   No current facility-administered medications for this visit.    Allergies:   Patient has no known allergies.    Social History:  The patient  reports that she has been smoking. She has a 20.00 pack-year smoking history. She has never used smokeless tobacco. She reports that she does not drink alcohol or use drugs.   Family History:  The patient's family history includes Arrhythmia in her father;  Huntington's disease in her mother and unknown relative; Hyperlipidemia in her father; Hypertension in her father.    ROS:  Please see the history of present illness.   Otherwise, review of systems are positive for none.   All other systems are reviewed and negative.    PHYSICAL EXAM: VS:  BP (!) 150/62   Pulse (!) 57   Ht 4\' 10"  (1.473 m)   Wt 182 lb (82.6 kg)   BMI 38.04 kg/m  , BMI Body mass index is 38.04 kg/m. GEN: Well nourished, well developed, in no acute distress  HEENT: normal  Neck: no JVD, carotid bruits, or masses Cardiac: RRR; no  rubs, or gallops,no edema . There is 1/ of 6 systolic ejection murmur in the aortic area and Respiratory:  clear to auscultation bilaterally, normal work of breathing GI: soft, nontender, nondistended, + BS MS: no deformity or atrophy  Skin: warm and dry, no rash Neuro:  Strength and sensation are intact Psych: euthymic mood, full affect   EKG:  EKG is ordered today. The ekg ordered today demonstrates sinus bradycardia with no significant ST or T wave changes.  Possible old septal infarct.   Recent Labs: No results found for requested labs within last 8760 hours.    Lipid Panel No results found for: CHOL, TRIG, HDL, CHOLHDL, VLDL, LDLCALC, LDLDIRECT    Wt Readings from Last 3 Encounters:  01/01/20 182 lb (82.6 kg)  01/09/19 182 lb 9.6 oz (82.8 kg)  03/07/18 181 lb 3.2 oz (82.2 kg)      Other studies Reviewed:   ASSESSMENT AND PLAN:  1.  Coronary artery disease involving native coronary arteries without angina: She is doing very well with no anginal symptoms.  EKG is unremarkable.  Continue medical therapy.  2.   Chronic diastolic heart failure: She appears to be euvolemic on current dose of furosemide.    3. Essential hypertension: Blood pressure is mildly elevated but she reports that she was rushing to the appointment.  Her blood pressure usually is more controlled.  Continue same medications for now.  3.  Hyperlipidemia: Continue high dose atorvastatin.  Recommend a target LDL of less than 70.  4. Tobacco use: I discussed with her the importance of smoking cessation.  She is not ready to quit.     Disposition:   FU with me in 12 months  Signed,  Kathlyn Sacramento, MD  01/01/2020 9:35 AM    [place]

## 2020-01-01 NOTE — Patient Instructions (Signed)
Medication Instructions:  No changes *If you need a refill on your cardiac medications before your next appointment, please call your pharmacy*  Lab Work: None ordered If you have labs (blood work) drawn today and your tests are completely normal, you will receive your results only by: . MyChart Message (if you have MyChart) OR . A paper copy in the mail If you have any lab test that is abnormal or we need to change your treatment, we will call you to review the results.  Testing/Procedures: None ordered  Follow-Up: At [facility], you and your health needs are our priority.  As part of our continuing mission to provide you with exceptional heart care, we have created designated Provider Care Teams.  These Care Teams include your primary Cardiologist (physician) and Advanced Practice Providers (APPs -  Physician Assistants and Nurse Practitioners) who all work together to provide you with the care you need, when you need it.  Your next appointment:   12 month(s)  The format for your next appointment:   In Person  Provider:   Muhammad Arida, MD   

## 2020-01-08 DIAGNOSIS — E119 Type 2 diabetes mellitus without complications: Secondary | ICD-10-CM | POA: Diagnosis not present

## 2020-01-08 DIAGNOSIS — R5382 Chronic fatigue, unspecified: Secondary | ICD-10-CM | POA: Diagnosis not present

## 2020-01-08 DIAGNOSIS — R5383 Other fatigue: Secondary | ICD-10-CM | POA: Diagnosis not present

## 2020-01-08 DIAGNOSIS — Z0000 Encounter for general adult medical examination without abnormal findings: Secondary | ICD-10-CM | POA: Diagnosis not present

## 2020-01-22 DIAGNOSIS — E1142 Type 2 diabetes mellitus with diabetic polyneuropathy: Secondary | ICD-10-CM | POA: Diagnosis not present

## 2020-02-11 DIAGNOSIS — Z794 Long term (current) use of insulin: Secondary | ICD-10-CM | POA: Diagnosis not present

## 2020-02-12 DIAGNOSIS — E1142 Type 2 diabetes mellitus with diabetic polyneuropathy: Secondary | ICD-10-CM | POA: Diagnosis not present

## 2020-02-12 DIAGNOSIS — M47817 Spondylosis without myelopathy or radiculopathy, lumbosacral region: Secondary | ICD-10-CM | POA: Diagnosis not present

## 2020-02-12 DIAGNOSIS — E1140 Type 2 diabetes mellitus with diabetic neuropathy, unspecified: Secondary | ICD-10-CM | POA: Diagnosis not present

## 2020-02-12 DIAGNOSIS — E119 Type 2 diabetes mellitus without complications: Secondary | ICD-10-CM | POA: Diagnosis not present

## 2020-02-12 DIAGNOSIS — G894 Chronic pain syndrome: Secondary | ICD-10-CM | POA: Diagnosis not present

## 2020-02-12 DIAGNOSIS — M158 Other polyosteoarthritis: Secondary | ICD-10-CM | POA: Diagnosis not present

## 2020-02-12 DIAGNOSIS — I10 Essential (primary) hypertension: Secondary | ICD-10-CM | POA: Diagnosis not present

## 2020-02-12 DIAGNOSIS — M5417 Radiculopathy, lumbosacral region: Secondary | ICD-10-CM | POA: Diagnosis not present

## 2020-02-19 DIAGNOSIS — E1142 Type 2 diabetes mellitus with diabetic polyneuropathy: Secondary | ICD-10-CM | POA: Diagnosis not present

## 2020-03-06 ENCOUNTER — Ambulatory Visit: Payer: BLUE CROSS/BLUE SHIELD | Admitting: Physician Assistant

## 2020-03-19 DIAGNOSIS — J209 Acute bronchitis, unspecified: Secondary | ICD-10-CM | POA: Diagnosis not present

## 2020-03-19 DIAGNOSIS — U071 COVID-19: Secondary | ICD-10-CM | POA: Diagnosis not present

## 2020-03-19 DIAGNOSIS — J101 Influenza due to other identified influenza virus with other respiratory manifestations: Secondary | ICD-10-CM | POA: Diagnosis not present

## 2020-03-19 DIAGNOSIS — R05 Cough: Secondary | ICD-10-CM | POA: Diagnosis not present

## 2020-03-19 DIAGNOSIS — B974 Respiratory syncytial virus as the cause of diseases classified elsewhere: Secondary | ICD-10-CM | POA: Diagnosis not present

## 2020-03-19 DIAGNOSIS — Z20828 Contact with and (suspected) exposure to other viral communicable diseases: Secondary | ICD-10-CM | POA: Diagnosis not present

## 2020-03-21 DIAGNOSIS — E1142 Type 2 diabetes mellitus with diabetic polyneuropathy: Secondary | ICD-10-CM | POA: Diagnosis not present

## 2020-03-27 DIAGNOSIS — I10 Essential (primary) hypertension: Secondary | ICD-10-CM | POA: Diagnosis not present

## 2020-03-27 DIAGNOSIS — J441 Chronic obstructive pulmonary disease with (acute) exacerbation: Secondary | ICD-10-CM | POA: Diagnosis not present

## 2020-03-27 DIAGNOSIS — R011 Cardiac murmur, unspecified: Secondary | ICD-10-CM | POA: Diagnosis not present

## 2020-03-27 DIAGNOSIS — R601 Generalized edema: Secondary | ICD-10-CM | POA: Diagnosis not present

## 2020-05-28 DIAGNOSIS — E119 Type 2 diabetes mellitus without complications: Secondary | ICD-10-CM | POA: Diagnosis not present

## 2020-05-28 DIAGNOSIS — I252 Old myocardial infarction: Secondary | ICD-10-CM | POA: Diagnosis not present

## 2020-05-28 DIAGNOSIS — M158 Other polyosteoarthritis: Secondary | ICD-10-CM | POA: Diagnosis not present

## 2020-05-28 DIAGNOSIS — J310 Chronic rhinitis: Secondary | ICD-10-CM | POA: Diagnosis not present

## 2020-05-28 DIAGNOSIS — R05 Cough: Secondary | ICD-10-CM | POA: Diagnosis not present

## 2020-05-28 DIAGNOSIS — J441 Chronic obstructive pulmonary disease with (acute) exacerbation: Secondary | ICD-10-CM | POA: Diagnosis not present

## 2020-05-28 DIAGNOSIS — R5382 Chronic fatigue, unspecified: Secondary | ICD-10-CM | POA: Diagnosis not present

## 2020-05-28 DIAGNOSIS — E785 Hyperlipidemia, unspecified: Secondary | ICD-10-CM | POA: Diagnosis not present

## 2020-05-28 DIAGNOSIS — E1140 Type 2 diabetes mellitus with diabetic neuropathy, unspecified: Secondary | ICD-10-CM | POA: Diagnosis not present

## 2020-05-28 DIAGNOSIS — I10 Essential (primary) hypertension: Secondary | ICD-10-CM | POA: Diagnosis not present

## 2020-09-02 ENCOUNTER — Ambulatory Visit: Payer: Self-pay | Admitting: Podiatry

## 2020-09-04 ENCOUNTER — Encounter: Payer: Self-pay | Admitting: Podiatry

## 2020-09-04 ENCOUNTER — Ambulatory Visit (INDEPENDENT_AMBULATORY_CARE_PROVIDER_SITE_OTHER): Payer: Medicare Other | Admitting: Podiatry

## 2020-09-04 ENCOUNTER — Other Ambulatory Visit: Payer: Self-pay

## 2020-09-04 DIAGNOSIS — E119 Type 2 diabetes mellitus without complications: Secondary | ICD-10-CM | POA: Diagnosis not present

## 2020-09-04 DIAGNOSIS — Q828 Other specified congenital malformations of skin: Secondary | ICD-10-CM

## 2020-09-04 DIAGNOSIS — D689 Coagulation defect, unspecified: Secondary | ICD-10-CM

## 2020-09-04 DIAGNOSIS — E1142 Type 2 diabetes mellitus with diabetic polyneuropathy: Secondary | ICD-10-CM | POA: Diagnosis not present

## 2020-09-04 NOTE — Progress Notes (Signed)
  Subjective:  Patient ID: [name], female    DOB: August 23, 1966,  MRN: [id number]  No chief complaint on file.   54 y.o. female presents with the above complaint. History confirmed with patient.  On Xarelto.  Reports numbness to some aspects of her feet.  States that the calluses on the bottom of her feet are very prepubic and difficulty walking  Objective:  Physical Exam: warm, good capillary refill, nail exam onychomycosis of the toenails, no trophic changes or ulcerative lesions. DP pulses palpable, PT pulses palpable and protective sensation intact H PK submet for bilateral  No images are attached to the encounter.  Assessment:   1. DM type 2 with diabetic peripheral neuropathy (HCC)   2. Porokeratosis   3. Coagulation defect (HCC)   4. Encounter for diabetic foot exam [facility])      Plan:  Patient was evaluated and treated and all questions answered.  Diabetes and Coagulation Defect  -Painful calluses debrided.  Patient meets criteria for high risk foot care  Procedure: Paring of Lesion Rationale: painful hyperkeratotic lesion Type of Debridement: manual, sharp debridement. Instrumentation: 312 blade Number of Lesions: 2  No follow-ups on file.

## 2020-10-05 DIAGNOSIS — I10 Essential (primary) hypertension: Secondary | ICD-10-CM | POA: Diagnosis not present

## 2020-10-05 DIAGNOSIS — J449 Chronic obstructive pulmonary disease, unspecified: Secondary | ICD-10-CM | POA: Diagnosis not present

## 2020-10-05 DIAGNOSIS — M158 Other polyosteoarthritis: Secondary | ICD-10-CM | POA: Diagnosis not present

## 2020-10-05 DIAGNOSIS — J441 Chronic obstructive pulmonary disease with (acute) exacerbation: Secondary | ICD-10-CM | POA: Diagnosis not present

## 2020-10-05 DIAGNOSIS — I252 Old myocardial infarction: Secondary | ICD-10-CM | POA: Diagnosis not present

## 2020-10-05 DIAGNOSIS — E119 Type 2 diabetes mellitus without complications: Secondary | ICD-10-CM | POA: Diagnosis not present

## 2020-10-05 DIAGNOSIS — E1140 Type 2 diabetes mellitus with diabetic neuropathy, unspecified: Secondary | ICD-10-CM | POA: Diagnosis not present

## 2020-10-05 DIAGNOSIS — E785 Hyperlipidemia, unspecified: Secondary | ICD-10-CM | POA: Diagnosis not present

## 2020-12-04 ENCOUNTER — Ambulatory Visit: Payer: Medicare Other | Admitting: Podiatry

## 2020-12-25 DIAGNOSIS — E119 Type 2 diabetes mellitus without complications: Secondary | ICD-10-CM | POA: Diagnosis not present

## 2020-12-25 DIAGNOSIS — E038 Other specified hypothyroidism: Secondary | ICD-10-CM | POA: Diagnosis not present

## 2020-12-25 DIAGNOSIS — I10 Essential (primary) hypertension: Secondary | ICD-10-CM | POA: Diagnosis not present

## 2020-12-25 DIAGNOSIS — E559 Vitamin D deficiency, unspecified: Secondary | ICD-10-CM | POA: Diagnosis not present

## 2020-12-25 DIAGNOSIS — D518 Other vitamin B12 deficiency anemias: Secondary | ICD-10-CM | POA: Diagnosis not present

## 2020-12-25 DIAGNOSIS — J449 Chronic obstructive pulmonary disease, unspecified: Secondary | ICD-10-CM | POA: Diagnosis not present

## 2020-12-25 DIAGNOSIS — R0982 Postnasal drip: Secondary | ICD-10-CM | POA: Diagnosis not present

## 2020-12-25 DIAGNOSIS — E782 Mixed hyperlipidemia: Secondary | ICD-10-CM | POA: Diagnosis not present

## 2020-12-30 ENCOUNTER — Ambulatory Visit: Payer: BC Managed Care – PPO | Admitting: Cardiovascular Disease

## 2021-01-02 DIAGNOSIS — E1140 Type 2 diabetes mellitus with diabetic neuropathy, unspecified: Secondary | ICD-10-CM | POA: Diagnosis not present

## 2021-01-02 DIAGNOSIS — J441 Chronic obstructive pulmonary disease with (acute) exacerbation: Secondary | ICD-10-CM | POA: Diagnosis not present

## 2021-01-02 DIAGNOSIS — J449 Chronic obstructive pulmonary disease, unspecified: Secondary | ICD-10-CM | POA: Diagnosis not present

## 2021-01-02 DIAGNOSIS — M158 Other polyosteoarthritis: Secondary | ICD-10-CM | POA: Diagnosis not present

## 2021-01-02 DIAGNOSIS — E785 Hyperlipidemia, unspecified: Secondary | ICD-10-CM | POA: Diagnosis not present

## 2021-01-02 DIAGNOSIS — I252 Old myocardial infarction: Secondary | ICD-10-CM | POA: Diagnosis not present

## 2021-01-02 DIAGNOSIS — E119 Type 2 diabetes mellitus without complications: Secondary | ICD-10-CM | POA: Diagnosis not present

## 2021-01-02 DIAGNOSIS — I10 Essential (primary) hypertension: Secondary | ICD-10-CM | POA: Diagnosis not present

## 2021-02-05 DIAGNOSIS — M5417 Radiculopathy, lumbosacral region: Secondary | ICD-10-CM | POA: Diagnosis not present

## 2021-02-05 DIAGNOSIS — E1142 Type 2 diabetes mellitus with diabetic polyneuropathy: Secondary | ICD-10-CM | POA: Diagnosis not present

## 2021-02-05 DIAGNOSIS — G894 Chronic pain syndrome: Secondary | ICD-10-CM | POA: Diagnosis not present

## 2021-02-05 DIAGNOSIS — M47817 Spondylosis without myelopathy or radiculopathy, lumbosacral region: Secondary | ICD-10-CM | POA: Diagnosis not present

## 2021-02-10 ENCOUNTER — Ambulatory Visit (INDEPENDENT_AMBULATORY_CARE_PROVIDER_SITE_OTHER): Payer: Medicare Other | Admitting: Cardiovascular Disease

## 2021-02-10 ENCOUNTER — Other Ambulatory Visit: Payer: Self-pay

## 2021-02-10 ENCOUNTER — Encounter: Payer: Self-pay | Admitting: Cardiovascular Disease

## 2021-02-10 VITALS — BP 154/76 | HR 59 | Ht <= 58 in | Wt 188.8 lb

## 2021-02-10 DIAGNOSIS — I25118 Atherosclerotic heart disease of native coronary artery with other forms of angina pectoris: Secondary | ICD-10-CM

## 2021-02-10 DIAGNOSIS — E785 Hyperlipidemia, unspecified: Secondary | ICD-10-CM

## 2021-02-10 DIAGNOSIS — I5032 Chronic diastolic (congestive) heart failure: Secondary | ICD-10-CM

## 2021-02-10 DIAGNOSIS — I10 Essential (primary) hypertension: Secondary | ICD-10-CM | POA: Diagnosis not present

## 2021-02-10 DIAGNOSIS — Z720 Tobacco use: Secondary | ICD-10-CM | POA: Diagnosis not present

## 2021-02-10 LAB — CBC
Hematocrit: 47.9 % — ABNORMAL HIGH (ref 34.0–46.6)
Hemoglobin: 15.7 g/dL (ref 11.1–15.9)
MCH: 26.1 pg — ABNORMAL LOW (ref 26.6–33.0)
MCHC: 32.8 g/dL (ref 31.5–35.7)
MCV: 80 fL (ref 79–97)
Platelets: 458 10*3/uL — ABNORMAL HIGH (ref 150–450)
RBC: 6.01 x10E6/uL — ABNORMAL HIGH (ref 3.77–5.28)
RDW: 14.6 % (ref 11.7–15.4)
WBC: 14.7 10*3/uL — ABNORMAL HIGH (ref 3.4–10.8)

## 2021-02-10 LAB — BASIC METABOLIC PANEL
BUN/Creatinine Ratio: 9 (ref 9–23)
BUN: 8 mg/dL (ref 6–24)
CO2: 24 mmol/L (ref 20–29)
Calcium: 9.6 mg/dL (ref 8.7–10.2)
Chloride: 100 mmol/L (ref 96–106)
Creatinine, Ser: 0.92 mg/dL (ref 0.57–1.00)
Glucose: 72 mg/dL (ref 65–99)
Potassium: 4.6 mmol/L (ref 3.5–5.2)
Sodium: 136 mmol/L (ref 134–144)
eGFR: 73 mL/min/{1.73_m2} (ref >59)

## 2021-02-10 NOTE — H&P (View-Only) (Signed)
Cardiology Office Note   Date:  02/10/2021   ID:  [name], DOB 1966-07-06, MRN [id number]  PCP:  Joaquin Music, NP  Cardiologist:   Lorine Bears, MD   No chief complaint on file.     History of Present Illness: [name] is a 55 y.o. female who presents for a followup visit regarding coronary artery disease. She is status post inferior myocardial infarction in July 2010. At that time she had an angioplasty and bare-metal stent placement to the right coronary artery.  Most recent stress test was in August of 2014  showed no evidence of ischemia with normal ejection fraction.  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 She is on longterm Fentanyl patch. She is on insulin pump for diabetes. Echocardiogram in December 2018 showed normal LV systolic function but there was evidence of diastolic dysfunction with elevated filling pressures.  Lower extremity arterial Doppler in 12/2017 showed normal ABI bilaterally. She continues to take small dose Xarelto which was prescribed by her primary care physician.  Over the last 6 months, she developed significant exertional dyspnea with occasional substernal chest tightness.  This happens with most activities and is associated with significant fatigue and sweating.  She underwent pulmonary evaluation but was told no significantly abnormal findings.  She did not improve with inhalers.  Unfortunately she continues to smoke.  She has gained about 6 pounds over the last year.  No symptoms at rest.  Past Medical History:  Diagnosis Date  . CAD (coronary artery disease)    s/p MI & stenting  . DM2 (diabetes mellitus, type 2) (HCC)   . HLD (hyperlipidemia)   . HTN (hypertension)   . MI (myocardial infarction) (HCC)   . Sympathetic reflex dystrophy   . Tobacco abuse     Past Surgical History:  Procedure Laterality Date  . CARDIAC CATHETERIZATION  06/05/2009   Inferior MI. Severe 1 vessel CAD (RCA)  .  CARPAL TUNNEL RELEASE  2002   left  . CHOLECYSTECTOMY  2006  . CORONARY ANGIOPLASTY WITH STENT PLACEMENT  06/05/2009   BMS (4.0 X 23 mm) Vision stent to RCA  . CORONARY STENT PLACEMENT  2010  . DILATION AND CURETTAGE OF UTERUS  1992 & 2004  . TONSILLECTOMY  1971  . TUBAL LIGATION  2005     Current Outpatient Medications  Medication Sig Dispense Refill  . amLODipine (NORVASC) 5 MG tablet Take 1 tablet (5 mg total) by mouth daily. 30 tablet 11  . aspirin 81 MG tablet Take 81 mg by mouth daily.    Marland Kitchen atorvastatin (LIPITOR) 80 MG tablet Take 1 tablet (80 mg total) by mouth every evening. 30 tablet 6  . azelastine (ASTELIN) 0.1 % nasal spray     . azithromycin (ZITHROMAX) 250 MG tablet     . carvedilol (COREG) 12.5 MG tablet 2 (two) times daily with a meal.   1  . DULoxetine (CYMBALTA) 60 MG capsule TAKE ONE CAPSULE BY MOUTH DAILY    . FARXIGA 10 MG TABS tablet Take 10 mg by mouth daily.    . fentaNYL (DURAGESIC) 25 MCG/HR Apply 1 patch to skin every 48 hours    . fluticasone furoate-vilanterol (BREO ELLIPTA) 100-25 MCG/INH AEPB 1 puff    . folic acid (FOLVITE) 1 MG tablet Take 1 mg by mouth daily.    . furosemide (LASIX) 20 MG tablet Take 20 mg by mouth daily.    Marland Kitchen gabapentin (NEURONTIN) 100 MG  capsule Take 100 mg by mouth daily.  0  . glycopyrrolate (ROBINUL) 2 MG tablet Take 2 mg by mouth as needed.     Marland Kitchen GVOKE HYPOPEN 2-PACK 1 MG/0.2ML SOAJ     . Insulin Disposable Pump (OMNIPOD 5 PACK) MISC     . magnesium gluconate (MAGONATE) 500 MG tablet Take 500 mg by mouth daily.    . nitroGLYCERIN (NITROSTAT) 0.4 MG SL tablet Place 1 tablet (0.4 mg total) under the tongue every 5 (five) minutes as needed. 25 tablet 1  . NOVOLOG 100 UNIT/ML injection Inject 120-150 units via insulin pump daily    . olmesartan (BENICAR) 40 MG tablet     . OLMESARTAN MEDOXOMIL PO Take 40 mg by mouth daily.    . Oxycodone HCl 10 MG TABS Take 1 tablet by mouth 3 (three) times daily.    . pantoprazole (PROTONIX) 40  MG tablet Take 40 mg by mouth 2 (two) times daily.    Carlena Hurl 2.5 MG TABS tablet take 1 tablet (2.5 mg) by oral route 2 times per day  2   No current facility-administered medications for this visit.    Allergies:   Patient has no known allergies.    Social History:  The patient  reports that she has been smoking. She has a 20.00 pack-year smoking history. She has never used smokeless tobacco. She reports that she does not drink alcohol and does not use drugs.   Family History:  The patient's family history includes Arrhythmia in her father; Huntington's disease in her mother and unknown relative; Hyperlipidemia in her father; Hypertension in her father.    ROS:  Please see the history of present illness.   Otherwise, review of systems are positive for none.   All other systems are reviewed and negative.    PHYSICAL EXAM: VS:  BP (!) 154/76   Pulse (!) 59   Ht 4\' 10"  (1.473 m)   Wt 188 lb 12.8 oz (85.6 kg)   SpO2 98%   BMI 39.46 kg/m  , BMI Body mass index is 39.46 kg/m. GEN: Well nourished, well developed, in no acute distress  HEENT: normal  Neck: no JVD, carotid bruits, or masses Cardiac: RRR; no  rubs, or gallops,no edema . There is 1/ of 6 systolic ejection murmur in the aortic area and Respiratory:  clear to auscultation bilaterally, normal work of breathing GI: soft, nontender, nondistended, + BS MS: no deformity or atrophy  Skin: warm and dry, no rash Neuro:  Strength and sensation are intact Psych: euthymic mood, full affect   EKG:  EKG is ordered today. The ekg ordered today demonstrates sinus bradycardia with old septal infarct.  No significant ST or T wave changes.   Recent Labs: No results found for requested labs within last 8760 hours.    Lipid Panel No results found for: CHOL, TRIG, HDL, CHOLHDL, VLDL, LDLCALC, LDLDIRECT    Wt Readings from Last 3 Encounters:  02/10/21 188 lb 12.8 oz (85.6 kg)  01/01/20 182 lb (82.6 kg)  01/09/19 182 lb 9.6 oz  (82.8 kg)      Other studies Reviewed:   ASSESSMENT AND PLAN:  1.  Coronary artery disease involving native coronary arteries with class III angina: The patient symptoms are concerning given her known history of coronary artery disease and multiple risk factors including prolonged history of diabetes and tobacco use.  She did have a Lexiscan Myoview stress test in 2014 that was unremarkable and she had significant reaction  to [place] at that time.  CTA is not an option given previous stent in the right coronary artery. I think her best option is to proceed with a right and left cardiac catheterization to fully evaluate her symptoms and also her dyspnea.  I discussed the procedure in details as well as risk and benefits. She is on small dose Xarelto for primary prevention and this can be held 2 days before cardiac cath.  2.   Chronic diastolic heart failure: She appears to be euvolemic on current dose of furosemide.  This will be evaluated with right heart catheterization.  3. Essential hypertension: Blood pressure is mildly elevated today and will consider increasing amlodipine..  3. Hyperlipidemia: Continue high dose atorvastatin.  Recommend a target LDL of less than 70.  4. Tobacco use: I discussed with her the importance of smoking cessation.  She is not ready to quit.     Disposition:   FU with me in few weeks after cardiac catheterization.  Signed,  Lorine Bears, MD  02/10/2021 10:40 AM    [facility]

## 2021-02-10 NOTE — Progress Notes (Signed)
  Cardiology Office Note   Date:  02/10/2021   ID:  Tharon L Dunklee, DOB 03/28/1966, MRN [id number]  PCP:  Harless, Emily, NP  Cardiologist:   Muhammad Arida, MD   No chief complaint on file.     History of Present Illness: [name] is a 54 y.o. female who presents for a followup visit regarding coronary artery disease. She is status post inferior myocardial infarction in July 2010. At that time she had an angioplasty and bare-metal stent placement to the right coronary artery.  Most recent stress test was in August of 2014  showed no evidence of ischemia with normal ejection fraction.  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 She is on longterm Fentanyl patch. She is on insulin pump for diabetes. Echocardiogram in December 2018 showed normal LV systolic function but there was evidence of diastolic dysfunction with elevated filling pressures.  Lower extremity arterial Doppler in 12/2017 showed normal ABI bilaterally. She continues to take small dose Xarelto which was prescribed by her primary care physician.  Over the last 6 months, she developed significant exertional dyspnea with occasional substernal chest tightness.  This happens with most activities and is associated with significant fatigue and sweating.  She underwent pulmonary evaluation but was told no significantly abnormal findings.  She did not improve with inhalers.  Unfortunately she continues to smoke.  She has gained about 6 pounds over the last year.  No symptoms at rest.  Past Medical History:  Diagnosis Date  . CAD (coronary artery disease)    s/p MI & stenting  . DM2 (diabetes mellitus, type 2) (HCC)   . HLD (hyperlipidemia)   . HTN (hypertension)   . MI (myocardial infarction) (HCC)   . Sympathetic reflex dystrophy   . Tobacco abuse     Past Surgical History:  Procedure Laterality Date  . CARDIAC CATHETERIZATION  06/05/2009   Inferior MI. Severe 1 vessel CAD (RCA)  .  CARPAL TUNNEL RELEASE  2002   left  . CHOLECYSTECTOMY  2006  . CORONARY ANGIOPLASTY WITH STENT PLACEMENT  06/05/2009   BMS (4.0 X 23 mm) Vision stent to RCA  . CORONARY STENT PLACEMENT  2010  . DILATION AND CURETTAGE OF UTERUS  1992 & 2004  . TONSILLECTOMY  1971  . TUBAL LIGATION  2005     Current Outpatient Medications  Medication Sig Dispense Refill  . amLODipine (NORVASC) 5 MG tablet Take 1 tablet (5 mg total) by mouth daily. 30 tablet 11  . aspirin 81 MG tablet Take 81 mg by mouth daily.    . atorvastatin (LIPITOR) 80 MG tablet Take 1 tablet (80 mg total) by mouth every evening. 30 tablet 6  . azelastine (ASTELIN) 0.1 % nasal spray     . azithromycin (ZITHROMAX) 250 MG tablet     . carvedilol (COREG) 12.5 MG tablet 2 (two) times daily with a meal.   1  . DULoxetine (CYMBALTA) 60 MG capsule TAKE ONE CAPSULE BY MOUTH DAILY    . FARXIGA 10 MG TABS tablet Take 10 mg by mouth daily.    . fentaNYL (DURAGESIC) 25 MCG/HR Apply 1 patch to skin every 48 hours    . fluticasone furoate-vilanterol (BREO ELLIPTA) 100-25 MCG/INH AEPB 1 puff    . folic acid (FOLVITE) 1 MG tablet Take 1 mg by mouth daily.    . furosemide (LASIX) 20 MG tablet Take 20 mg by mouth daily.    . gabapentin (NEURONTIN) 100 MG   capsule Take 100 mg by mouth daily.  0  . glycopyrrolate (ROBINUL) 2 MG tablet Take 2 mg by mouth as needed.     Marland Kitchen GVOKE HYPOPEN 2-PACK 1 MG/0.2ML SOAJ     . Insulin Disposable Pump (OMNIPOD 5 PACK) MISC     . magnesium gluconate (MAGONATE) 500 MG tablet Take 500 mg by mouth daily.    . nitroGLYCERIN (NITROSTAT) 0.4 MG SL tablet Place 1 tablet (0.4 mg total) under the tongue every 5 (five) minutes as needed. 25 tablet 1  . NOVOLOG 100 UNIT/ML injection Inject 120-150 units via insulin pump daily    . olmesartan (BENICAR) 40 MG tablet     . OLMESARTAN MEDOXOMIL PO Take 40 mg by mouth daily.    . Oxycodone HCl 10 MG TABS Take 1 tablet by mouth 3 (three) times daily.    . pantoprazole (PROTONIX) 40  MG tablet Take 40 mg by mouth 2 (two) times daily.    Carlena Hurl 2.5 MG TABS tablet take 1 tablet (2.5 mg) by oral route 2 times per day  2   No current facility-administered medications for this visit.    Allergies:   Patient has no known allergies.    Social History:  The patient  reports that she has been smoking. She has a 20.00 pack-year smoking history. She has never used smokeless tobacco. She reports that she does not drink alcohol and does not use drugs.   Family History:  The patient's family history includes Arrhythmia in her father; Huntington's disease in her mother and unknown relative; Hyperlipidemia in her father; Hypertension in her father.    ROS:  Please see the history of present illness.   Otherwise, review of systems are positive for none.   All other systems are reviewed and negative.    PHYSICAL EXAM: VS:  BP (!) 154/76   Pulse (!) 59   Ht 4\' 10"  (1.473 m)   Wt 188 lb 12.8 oz (85.6 kg)   SpO2 98%   BMI 39.46 kg/m  , BMI Body mass index is 39.46 kg/m. GEN: Well nourished, well developed, in no acute distress  HEENT: normal  Neck: no JVD, carotid bruits, or masses Cardiac: RRR; no  rubs, or gallops,no edema . There is 1/ of 6 systolic ejection murmur in the aortic area and Respiratory:  clear to auscultation bilaterally, normal work of breathing GI: soft, nontender, nondistended, + BS MS: no deformity or atrophy  Skin: warm and dry, no rash Neuro:  Strength and sensation are intact Psych: euthymic mood, full affect   EKG:  EKG is ordered today. The ekg ordered today demonstrates sinus bradycardia with old septal infarct.  No significant ST or T wave changes.   Recent Labs: No results found for requested labs within last 8760 hours.    Lipid Panel No results found for: CHOL, TRIG, HDL, CHOLHDL, VLDL, LDLCALC, LDLDIRECT    Wt Readings from Last 3 Encounters:  02/10/21 188 lb 12.8 oz (85.6 kg)  01/01/20 182 lb (82.6 kg)  01/09/19 182 lb 9.6 oz  (82.8 kg)      Other studies Reviewed:   ASSESSMENT AND PLAN:  1.  Coronary artery disease involving native coronary arteries with class III angina: The patient symptoms are concerning given her known history of coronary artery disease and multiple risk factors including prolonged history of diabetes and tobacco use.  She did have a Lexiscan Myoview stress test in 2014 that was unremarkable and she had significant reaction  to [place] at that time.  CTA is not an option given previous stent in the right coronary artery. I think her best option is to proceed with a right and left cardiac catheterization to fully evaluate her symptoms and also her dyspnea.  I discussed the procedure in details as well as risk and benefits. She is on small dose Xarelto for primary prevention and this can be held 2 days before cardiac cath.  2.   Chronic diastolic heart failure: She appears to be euvolemic on current dose of furosemide.  This will be evaluated with right heart catheterization.  3. Essential hypertension: Blood pressure is mildly elevated today and will consider increasing amlodipine..  3. Hyperlipidemia: Continue high dose atorvastatin.  Recommend a target LDL of less than 70.  4. Tobacco use: I discussed with her the importance of smoking cessation.  She is not ready to quit.     Disposition:   FU with me in few weeks after cardiac catheterization.  Signed,  Lorine Bears, MD  02/10/2021 10:40 AM    [facility]

## 2021-02-10 NOTE — Patient Instructions (Addendum)
Medication Instructions:  No changes *If you need a refill on your cardiac medications before your next appointment, please call your pharmacy*   Testing/Procedures: Your physician has requested that you have a cardiac catheterization. Cardiac catheterization is used to diagnose and/or treat various heart conditions. Doctors may recommend this procedure for a number of different reasons. The most common reason is to evaluate chest pain. Chest pain can be a symptom of coronary artery disease (CAD), and cardiac catheterization can show whether plaque is narrowing or blocking your heart's arteries. This procedure is also used to evaluate the valves, as well as measure the blood flow and oxygen levels in different parts of your heart.    Follow-Up: At [facility], you and your health needs are our priority.  As part of our continuing mission to provide you with exceptional heart care, we have created designated Provider Care Teams.  These Care Teams include your primary Cardiologist (physician) and Advanced Practice Providers (APPs -  Physician Assistants and Nurse Practitioners) who all work together to provide you with the care you need, when you need it.  We recommend signing up for the patient portal called "MyChart".  Sign up information is provided on this After Visit Summary.  MyChart is used to connect with patients for Virtual Visits (Telemedicine).  Patients are able to view lab/test results, encounter notes, upcoming appointments, etc.  Non-urgent messages can be sent to your provider as well.   To learn more about what you can do with MyChart, go to [email or web address].    Your next appointment:   Keep your follow up with Dr. Kirke Corin on 03/24/21 at 8:40  Other Instructions    [facility] CARDIOVASCULAR DIVISION [facility] [address] [place] [address] Dept: [phone] Loc: [phone]  NIREL BABLER  02/10/2021  You  are scheduled for a Cardiac Catheterization on Friday, March 25 with Dr. Lorine Bears.  1. Please arrive at the [facility] (Main Entrance A) at [facility]: [address] [place], [place] [address] at 5:30 AM (This time is two hours before your procedure to ensure your preparation). Free valet parking service is available.   Special note: Every effort is made to have your procedure done on time. Please understand that emergencies sometimes delay scheduled procedures.  2. Diet: Do not eat solid foods after midnight.  The patient may have clear liquids until 5am upon the day of the procedure.  3. Labs: You will need to have blood drawn today: BMET and CBC  You will need to have the coronavirus test completed prior to your procedure. An appointment has been made at 10 am 02/24/21. This is a Drive Up Visit at [address] [place], [place], [place] [address]. Please tell them that you are there for procedure testing. Stay in your car and someone will be with you shortly. Please make sure to have all other labs completed before this test because you will need to stay quarantined until your procedure.   4. Medication instructions in preparation for your procedure: Hold all diabetic medication the morning of the procedure (please adjust the insulin pump accordingly so you do not drop in your levels for the procedure) Hold the Xarelto 2 days prior to the procedure (3/23 and 3/24) Hold the Furosemide the morning of the procedure   On the morning of your procedure, take your Aspirin and any morning medicines NOT listed above.  You may use sips of water.  5.  Plan for one night stay--bring personal belongings. 6. Bring a current list of your medications and current insurance cards. 7. You MUST have a responsible person to drive you home. 8. Someone MUST be with you the first 24 hours after you arrive home or your discharge will be delayed. 9. Please wear clothes that are easy to get on and off  and wear slip-on shoes.  Thank you for allowing [place] to care for you!   -- [facility]

## 2021-02-24 ENCOUNTER — Other Ambulatory Visit (HOSPITAL_COMMUNITY)
Admission: RE | Admit: 2021-02-24 | Discharge: 2021-02-24 | Disposition: A | Discharge status: Home / Self Care | Payer: BC Managed Care – PPO | Source: Ambulatory Visit | Attending: Cardiovascular Disease | Admitting: Cardiovascular Disease

## 2021-02-24 ENCOUNTER — Other Ambulatory Visit (HOSPITAL_COMMUNITY): Payer: BC Managed Care – PPO

## 2021-02-24 DIAGNOSIS — Z01812 Encounter for preprocedural laboratory examination: Secondary | ICD-10-CM | POA: Diagnosis not present

## 2021-02-24 DIAGNOSIS — Z20822 Contact with and (suspected) exposure to covid-19: Secondary | ICD-10-CM | POA: Insufficient documentation

## 2021-02-24 LAB — SARS CORONAVIRUS 2 (TAT 6-24 HRS): SARS Coronavirus 2: NEGATIVE

## 2021-02-26 ENCOUNTER — Telehealth: Payer: Self-pay | Admitting: *Deleted

## 2021-02-26 NOTE — Telephone Encounter (Signed)
Pt contacted pre-catheterization scheduled at [facility] for: Friday February 27, 2021 7:30 AM Verified arrival time and place: [facility] Main Entrance A [facility]) at: 5:30 AM    No solid food after midnight prior to cath, clear liquids until 5 AM day of procedure.  Hold: Farxiga-AM of procedure Lasix-AM of procedure Xarelto-none PM 02/25/21 until post procedure Insulin pump-pt will manage/will bring pump  Except hold medications AM meds can be  taken pre-cath with sips of water including: ASA 81 mg   Confirmed patient has responsible adult to drive home post procedure and be with patient first 24 hours after arriving home: yes  You are allowed ONE visitor in the waiting room during the time you are at the hospital for your procedure. Both you and your visitor must wear a mask once you enter the hospital.    Reviewed procedure/mask/visitor instructions with patient.

## 2021-02-27 ENCOUNTER — Ambulatory Visit (HOSPITAL_COMMUNITY)
Admission: RE | Disposition: A | Discharge status: Home / Self Care | Payer: Self-pay | Source: Home / Self Care | Attending: Cardiovascular Disease

## 2021-02-27 ENCOUNTER — Other Ambulatory Visit: Payer: Self-pay

## 2021-02-27 ENCOUNTER — Ambulatory Visit (HOSPITAL_COMMUNITY)
Admission: RE | Admit: 2021-02-27 | Discharge: 2021-02-28 | Disposition: A | Discharge status: Home / Self Care | Payer: BC Managed Care – PPO | Attending: Cardiovascular Disease | Admitting: Cardiovascular Disease

## 2021-02-27 ENCOUNTER — Encounter (HOSPITAL_COMMUNITY): Payer: Self-pay | Admitting: Cardiovascular Disease

## 2021-02-27 DIAGNOSIS — Z7984 Long term (current) use of oral hypoglycemic drugs: Secondary | ICD-10-CM | POA: Insufficient documentation

## 2021-02-27 DIAGNOSIS — Z955 Presence of coronary angioplasty implant and graft: Secondary | ICD-10-CM | POA: Diagnosis not present

## 2021-02-27 DIAGNOSIS — Z794 Long term (current) use of insulin: Secondary | ICD-10-CM | POA: Diagnosis not present

## 2021-02-27 DIAGNOSIS — I272 Pulmonary hypertension, unspecified: Secondary | ICD-10-CM | POA: Diagnosis not present

## 2021-02-27 DIAGNOSIS — Z79899 Other long term (current) drug therapy: Secondary | ICD-10-CM | POA: Diagnosis not present

## 2021-02-27 DIAGNOSIS — E785 Hyperlipidemia, unspecified: Secondary | ICD-10-CM | POA: Diagnosis not present

## 2021-02-27 DIAGNOSIS — Z720 Tobacco use: Secondary | ICD-10-CM | POA: Diagnosis present

## 2021-02-27 DIAGNOSIS — I110 Hypertensive heart disease with heart failure: Secondary | ICD-10-CM | POA: Insufficient documentation

## 2021-02-27 DIAGNOSIS — I5032 Chronic diastolic (congestive) heart failure: Secondary | ICD-10-CM | POA: Insufficient documentation

## 2021-02-27 DIAGNOSIS — F17210 Nicotine dependence, cigarettes, uncomplicated: Secondary | ICD-10-CM | POA: Diagnosis not present

## 2021-02-27 DIAGNOSIS — Z7982 Long term (current) use of aspirin: Secondary | ICD-10-CM | POA: Insufficient documentation

## 2021-02-27 DIAGNOSIS — Z9641 Presence of insulin pump (external) (internal): Secondary | ICD-10-CM | POA: Insufficient documentation

## 2021-02-27 DIAGNOSIS — I25118 Atherosclerotic heart disease of native coronary artery with other forms of angina pectoris: Secondary | ICD-10-CM | POA: Insufficient documentation

## 2021-02-27 DIAGNOSIS — E119 Type 2 diabetes mellitus without complications: Secondary | ICD-10-CM | POA: Insufficient documentation

## 2021-02-27 DIAGNOSIS — I208 Other forms of angina pectoris: Secondary | ICD-10-CM | POA: Diagnosis present

## 2021-02-27 DIAGNOSIS — I2510 Atherosclerotic heart disease of native coronary artery without angina pectoris: Secondary | ICD-10-CM | POA: Diagnosis present

## 2021-02-27 HISTORY — PX: CORONARY STENT PLACEMENT: SHX6853

## 2021-02-27 HISTORY — PX: RIGHT/LEFT HEART CATH AND CORONARY ANGIOGRAPHY: CATH118266

## 2021-02-27 HISTORY — PX: CORONARY IMAGING/OCT: CATH118326

## 2021-02-27 LAB — POCT I-STAT 7, (LYTES, BLD GAS, ICA,H+H)
Acid-Base Excess: 4 mmol/L — ABNORMAL HIGH (ref 0.0–2.0)
Acid-base deficit: 5 mmol/L — ABNORMAL HIGH (ref 0.0–2.0)
Bicarbonate: 31.3 mmol/L — ABNORMAL HIGH (ref 20.0–28.0)
Bicarbonate: 22.6 mmol/L (ref 20.0–28.0)
Calcium, Ion: 1.16 mmol/L (ref 1.15–1.40)
Calcium, Ion: 0.89 mmol/L — CL (ref 1.15–1.40)
HCT: 46 % (ref 36.0–46.0)
HCT: 43 % (ref 36.0–46.0)
Hemoglobin: 15.6 g/dL — ABNORMAL HIGH (ref 12.0–15.0)
Hemoglobin: 14.6 g/dL (ref 12.0–15.0)
O2 Saturation: 99 %
O2 Saturation: 99 %
Potassium: 4 mmol/L (ref 3.5–5.1)
Potassium: 3 mmol/L — ABNORMAL LOW (ref 3.5–5.1)
Sodium: 141 mmol/L (ref 135–145)
Sodium: 124 mmol/L — ABNORMAL LOW (ref 135–145)
TCO2: 33 mmol/L — ABNORMAL HIGH (ref 22–32)
TCO2: 24 mmol/L (ref 22–32)
pCO2 arterial: 56.8 mmHg — ABNORMAL HIGH (ref 32.0–48.0)
pCO2 arterial: 49.4 mmHg — ABNORMAL HIGH (ref 32.0–48.0)
pH, Arterial: 7.35 (ref 7.350–7.450)
pH, Arterial: 7.268 — ABNORMAL LOW (ref 7.350–7.450)
pO2, Arterial: 138 mmHg — ABNORMAL HIGH (ref 83.0–108.0)
pO2, Arterial: 142 mmHg — ABNORMAL HIGH (ref 83.0–108.0)

## 2021-02-27 LAB — POCT I-STAT, CHEM 8
BUN: 11 mg/dL (ref 6–20)
Calcium, Ion: 1.19 mmol/L (ref 1.15–1.40)
Chloride: 100 mmol/L (ref 98–111)
Creatinine, Ser: 0.8 mg/dL (ref 0.44–1.00)
Glucose, Bld: 105 mg/dL — ABNORMAL HIGH (ref 70–99)
HCT: 44 % (ref 36.0–46.0)
Hemoglobin: 15 g/dL (ref 12.0–15.0)
Potassium: 4 mmol/L (ref 3.5–5.1)
Sodium: 140 mmol/L (ref 135–145)
TCO2: 29 mmol/L (ref 22–32)

## 2021-02-27 LAB — GLUCOSE, CAPILLARY
Glucose-Capillary: 147 mg/dL — ABNORMAL HIGH (ref 70–99)
Glucose-Capillary: 50 mg/dL — ABNORMAL LOW (ref 70–99)
Glucose-Capillary: 77 mg/dL (ref 70–99)
Glucose-Capillary: 133 mg/dL — ABNORMAL HIGH (ref 70–99)
Glucose-Capillary: 148 mg/dL — ABNORMAL HIGH (ref 70–99)

## 2021-02-27 LAB — POCT I-STAT EG7
Acid-Base Excess: 3 mmol/L — ABNORMAL HIGH (ref 0.0–2.0)
Acid-Base Excess: 5 mmol/L — ABNORMAL HIGH (ref 0.0–2.0)
Bicarbonate: 30.7 mmol/L — ABNORMAL HIGH (ref 20.0–28.0)
Bicarbonate: 32 mmol/L — ABNORMAL HIGH (ref 20.0–28.0)
Calcium, Ion: 1.12 mmol/L — ABNORMAL LOW (ref 1.15–1.40)
Calcium, Ion: 1.19 mmol/L (ref 1.15–1.40)
HCT: 44 % (ref 36.0–46.0)
HCT: 46 % (ref 36.0–46.0)
Hemoglobin: 15 g/dL (ref 12.0–15.0)
Hemoglobin: 15.6 g/dL — ABNORMAL HIGH (ref 12.0–15.0)
O2 Saturation: 70 %
O2 Saturation: 72 %
Potassium: 3.9 mmol/L (ref 3.5–5.1)
Potassium: 4.1 mmol/L (ref 3.5–5.1)
Sodium: 141 mmol/L (ref 135–145)
Sodium: 140 mmol/L (ref 135–145)
TCO2: 32 mmol/L (ref 22–32)
TCO2: 34 mmol/L — ABNORMAL HIGH (ref 22–32)
pCO2, Ven: 56.6 mmHg (ref 44.0–60.0)
pCO2, Ven: 56.8 mmHg (ref 44.0–60.0)
pH, Ven: 7.343 (ref 7.250–7.430)
pH, Ven: 7.36 (ref 7.250–7.430)
pO2, Ven: 40 mmHg (ref 32.0–45.0)
pO2, Ven: 40 mmHg (ref 32.0–45.0)

## 2021-02-27 LAB — POCT ACTIVATED CLOTTING TIME: Activated Clotting Time: 303 seconds

## 2021-02-27 SURGERY — RIGHT/LEFT HEART CATH AND CORONARY ANGIOGRAPHY
Anesthesia: LOCAL

## 2021-02-27 MED ORDER — SODIUM CHLORIDE 0.9% FLUSH
3.0000 mL | Freq: Two times a day (BID) | INTRAVENOUS | Status: DC
  Administered 2021-02-27: 3 mL via INTRAVENOUS

## 2021-02-27 MED ORDER — SODIUM CHLORIDE 0.9% FLUSH: 3.0000 mL | Freq: Two times a day (BID) | INTRAVENOUS | Status: DC

## 2021-02-27 MED ORDER — LIDOCAINE HCL (PF) 1 % IJ SOLN
INTRAMUSCULAR | Status: AC
  Filled 2021-02-27: qty 30

## 2021-02-27 MED ORDER — SODIUM CHLORIDE 0.9 % IV SOLN: INTRAVENOUS | Status: AC

## 2021-02-27 MED ORDER — CLOPIDOGREL BISULFATE 300 MG PO TABS
ORAL_TABLET | ORAL | Status: DC | PRN
  Administered 2021-02-27: 600 mg via ORAL

## 2021-02-27 MED ORDER — ONDANSETRON HCL 4 MG/2ML IJ SOLN: 4.0000 mg | Freq: Four times a day (QID) | INTRAMUSCULAR | Status: DC | PRN

## 2021-02-27 MED ORDER — FAMOTIDINE IN NACL 20-0.9 MG/50ML-% IV SOLN
INTRAVENOUS | Status: AC
  Filled 2021-02-27: qty 50

## 2021-02-27 MED ORDER — GLYCOPYRROLATE 1 MG PO TABS: 1.0000 mg | ORAL_TABLET | Freq: Every day | ORAL | Status: DC | PRN

## 2021-02-27 MED ORDER — OXYCODONE HCL 5 MG PO TABS
10.0000 mg | ORAL_TABLET | Freq: Three times a day (TID) | ORAL | Status: DC
  Administered 2021-02-27 – 2021-02-28 (×3): 10 mg via ORAL
  Filled 2021-02-27 (×3): qty 2

## 2021-02-27 MED ORDER — ASPIRIN EC 81 MG PO TBEC
81.0000 mg | DELAYED_RELEASE_TABLET | Freq: Every day | ORAL | Status: DC
  Administered 2021-02-28: 81 mg via ORAL
  Filled 2021-02-27: qty 1

## 2021-02-27 MED ORDER — CARVEDILOL 12.5 MG PO TABS
12.5000 mg | ORAL_TABLET | Freq: Two times a day (BID) | ORAL | Status: DC
  Administered 2021-02-27 – 2021-02-28 (×2): 12.5 mg via ORAL
  Filled 2021-02-27 (×2): qty 1

## 2021-02-27 MED ORDER — FENTANYL CITRATE (PF) 100 MCG/2ML IJ SOLN
INTRAMUSCULAR | Status: DC | PRN
  Administered 2021-02-27: 25 ug via INTRAVENOUS
  Administered 2021-02-27: 50 ug via INTRAVENOUS
  Administered 2021-02-27: 25 ug via INTRAVENOUS

## 2021-02-27 MED ORDER — LABETALOL HCL 5 MG/ML IV SOLN
10.0000 mg | INTRAVENOUS | Status: AC | PRN
Start: 2021-02-27 — End: 2021-02-27

## 2021-02-27 MED ORDER — CLOPIDOGREL BISULFATE 300 MG PO TABS
ORAL_TABLET | ORAL | Status: AC
  Filled 2021-02-27: qty 2

## 2021-02-27 MED ORDER — SODIUM CHLORIDE 0.9 % IV SOLN: INTRAVENOUS | Status: DC

## 2021-02-27 MED ORDER — DEXTROSE 50 % IV SOLN
INTRAVENOUS | Status: AC
  Administered 2021-02-27: 12.5 g via INTRAVENOUS
  Filled 2021-02-27: qty 50

## 2021-02-27 MED ORDER — IRBESARTAN 150 MG PO TABS
300.0000 mg | ORAL_TABLET | Freq: Every day | ORAL | Status: DC
  Administered 2021-02-28: 300 mg via ORAL
  Filled 2021-02-27: qty 2

## 2021-02-27 MED ORDER — PANTOPRAZOLE SODIUM 40 MG PO TBEC
40.0000 mg | DELAYED_RELEASE_TABLET | Freq: Every day | ORAL | Status: DC
  Administered 2021-02-28: 40 mg via ORAL
  Filled 2021-02-27: qty 1

## 2021-02-27 MED ORDER — HEPARIN SODIUM (PORCINE) 1000 UNIT/ML IJ SOLN
INTRAMUSCULAR | Status: DC | PRN
  Administered 2021-02-27: 4500 [IU] via INTRAVENOUS
  Administered 2021-02-27: 4000 [IU] via INTRAVENOUS

## 2021-02-27 MED ORDER — MIDAZOLAM HCL 2 MG/2ML IJ SOLN
INTRAMUSCULAR | Status: AC
  Filled 2021-02-27: qty 2

## 2021-02-27 MED ORDER — VERAPAMIL HCL 2.5 MG/ML IV SOLN
INTRAVENOUS | Status: AC
  Filled 2021-02-27: qty 2

## 2021-02-27 MED ORDER — DEXTROSE 50 % IV SOLN: 12.5000 g | INTRAVENOUS | Status: AC

## 2021-02-27 MED ORDER — LIDOCAINE HCL (PF) 1 % IJ SOLN
INTRAMUSCULAR | Status: DC | PRN
  Administered 2021-02-27: 5 mL
  Administered 2021-02-27: 2 mL

## 2021-02-27 MED ORDER — FENTANYL 25 MCG/HR TD PT72: 1.0000 | MEDICATED_PATCH | TRANSDERMAL | Status: DC

## 2021-02-27 MED ORDER — FENTANYL CITRATE (PF) 100 MCG/2ML IJ SOLN
INTRAMUSCULAR | Status: AC
  Filled 2021-02-27: qty 2

## 2021-02-27 MED ORDER — SODIUM CHLORIDE 0.9% FLUSH: 3.0000 mL | INTRAVENOUS | Status: DC | PRN

## 2021-02-27 MED ORDER — FAMOTIDINE IN NACL 20-0.9 MG/50ML-% IV SOLN
INTRAVENOUS | Status: AC | PRN
  Administered 2021-02-27: 20 mg via INTRAVENOUS

## 2021-02-27 MED ORDER — IOHEXOL 350 MG/ML SOLN
INTRAVENOUS | Status: DC | PRN
  Administered 2021-02-27: 120 mL

## 2021-02-27 MED ORDER — VITAMIN B-12 100 MCG PO TABS
50.0000 ug | ORAL_TABLET | Freq: Every day | ORAL | Status: DC
  Filled 2021-02-27: qty 1

## 2021-02-27 MED ORDER — HEPARIN (PORCINE) IN NACL 1000-0.9 UT/500ML-% IV SOLN
INTRAVENOUS | Status: DC | PRN
  Administered 2021-02-27 (×2): 500 mL

## 2021-02-27 MED ORDER — AMLODIPINE BESYLATE 5 MG PO TABS
5.0000 mg | ORAL_TABLET | Freq: Every day | ORAL | Status: DC
  Administered 2021-02-27 – 2021-02-28 (×2): 5 mg via ORAL
  Filled 2021-02-27 (×2): qty 1

## 2021-02-27 MED ORDER — SODIUM CHLORIDE 0.9 % IV SOLN: 250.0000 mL | INTRAVENOUS | Status: DC | PRN

## 2021-02-27 MED ORDER — FLUTICASONE FUROATE-VILANTEROL 100-25 MCG/INH IN AEPB
1.0000 | INHALATION_SPRAY | Freq: Every day | RESPIRATORY_TRACT | Status: DC
  Administered 2021-02-28: 1 via RESPIRATORY_TRACT
  Filled 2021-02-27: qty 28

## 2021-02-27 MED ORDER — ACETAMINOPHEN 325 MG PO TABS: 650.0000 mg | ORAL_TABLET | ORAL | Status: DC | PRN

## 2021-02-27 MED ORDER — VITAMIN D (ERGOCALCIFEROL) 1.25 MG (50000 UNIT) PO CAPS: 50000.0000 [IU] | ORAL_CAPSULE | ORAL | Status: DC

## 2021-02-27 MED ORDER — HYDRALAZINE HCL 20 MG/ML IJ SOLN: 10.0000 mg | INTRAMUSCULAR | Status: AC | PRN

## 2021-02-27 MED ORDER — MIDAZOLAM HCL 2 MG/2ML IJ SOLN
INTRAMUSCULAR | Status: DC | PRN
  Administered 2021-02-27: 2 mg via INTRAVENOUS

## 2021-02-27 MED ORDER — HYDRALAZINE HCL 20 MG/ML IJ SOLN
INTRAMUSCULAR | Status: DC | PRN
  Administered 2021-02-27: 10 mg via INTRAVENOUS

## 2021-02-27 MED ORDER — ATORVASTATIN CALCIUM 80 MG PO TABS
80.0000 mg | ORAL_TABLET | Freq: Every evening | ORAL | Status: DC
  Administered 2021-02-27: 80 mg via ORAL
  Filled 2021-02-27: qty 1

## 2021-02-27 MED ORDER — DULOXETINE HCL 60 MG PO CPEP
60.0000 mg | ORAL_CAPSULE | Freq: Every day | ORAL | Status: DC
  Filled 2021-02-27 (×2): qty 1

## 2021-02-27 MED ORDER — VERAPAMIL HCL 2.5 MG/ML IV SOLN
INTRAVENOUS | Status: DC | PRN
  Administered 2021-02-27: 10 mL via INTRA_ARTERIAL

## 2021-02-27 MED ORDER — NITROGLYCERIN 1 MG/10 ML FOR IR/CATH LAB
INTRA_ARTERIAL | Status: AC
  Filled 2021-02-27: qty 10

## 2021-02-27 MED ORDER — FUROSEMIDE 20 MG PO TABS
20.0000 mg | ORAL_TABLET | Freq: Two times a day (BID) | ORAL | Status: DC
  Administered 2021-02-27: 20 mg via ORAL
  Filled 2021-02-27 (×2): qty 1

## 2021-02-27 MED ORDER — DAPAGLIFLOZIN PROPANEDIOL 10 MG PO TABS
10.0000 mg | ORAL_TABLET | Freq: Every day | ORAL | Status: DC
  Administered 2021-02-28: 10 mg via ORAL
  Filled 2021-02-27: qty 1

## 2021-02-27 MED ORDER — CLOPIDOGREL BISULFATE 75 MG PO TABS
75.0000 mg | ORAL_TABLET | Freq: Every day | ORAL | Status: DC
  Administered 2021-02-28: 75 mg via ORAL
  Filled 2021-02-27: qty 1

## 2021-02-27 MED ORDER — HEPARIN SODIUM (PORCINE) 1000 UNIT/ML IJ SOLN
INTRAMUSCULAR | Status: AC
  Filled 2021-02-27: qty 1

## 2021-02-27 MED ORDER — HYDRALAZINE HCL 20 MG/ML IJ SOLN
INTRAMUSCULAR | Status: AC
  Filled 2021-02-27: qty 1

## 2021-02-27 MED ORDER — DEXTROSE 50 % IV SOLN
12.5000 g | Freq: Once | INTRAVENOUS | Status: AC
  Administered 2021-02-27: 12.5 g via INTRAVENOUS

## 2021-02-27 MED ORDER — HEPARIN (PORCINE) IN NACL 1000-0.9 UT/500ML-% IV SOLN
INTRAVENOUS | Status: AC
  Filled 2021-02-27: qty 1000

## 2021-02-27 MED ORDER — ASPIRIN 81 MG PO CHEW: 81.0000 mg | CHEWABLE_TABLET | ORAL | Status: DC

## 2021-02-27 SURGICAL SUPPLY — 21 items
BALLN SAPPHIRE ~~LOC~~ 3.75X15 (BALLOONS) ×2 IMPLANT
CATH DRAGONFLY OPSTAR (CATHETERS) ×2 IMPLANT
CATH INFINITI 5 FR JL3.5 (CATHETERS) ×2 IMPLANT
CATH INFINITI 5FR JK (CATHETERS) ×2 IMPLANT
CATH LAUNCHER 5F JR4 (CATHETERS) ×2 IMPLANT
CATH SWAN GANZ 7F STRAIGHT (CATHETERS) ×2 IMPLANT
DEVICE RAD COMP TR BAND LRG (VASCULAR PRODUCTS) ×2 IMPLANT
GLIDESHEATH SLEND SS 6F .021 (SHEATH) ×2 IMPLANT
GLIDESHEATH SLENDER 7FR .021G (SHEATH) ×2 IMPLANT
GUIDEWIRE .025 260CM (WIRE) ×2 IMPLANT
GUIDEWIRE INQWIRE 1.5J.035X260 (WIRE) ×1 IMPLANT
GUIDEWIRE PRESSURE X 175 (WIRE) ×2 IMPLANT
INQWIRE 1.5J .035X260CM (WIRE) ×2
KIT ENCORE 40 (KITS) ×2 IMPLANT
KIT ESSENTIALS PG (KITS) ×2 IMPLANT
KIT HEART LEFT (KITS) ×2 IMPLANT
PACK CARDIAC CATHETERIZATION (CUSTOM PROCEDURE TRAY) ×2 IMPLANT
STENT RESOLUTE ONYX 3.5X34 (Permanent Stent) ×2 IMPLANT
SYR MEDRAD MARK 7 150ML (SYRINGE) ×2 IMPLANT
TRANSDUCER W/STOPCOCK (MISCELLANEOUS) ×2 IMPLANT
TUBING CIL FLEX 10 FLL-RA (TUBING) ×2 IMPLANT

## 2021-02-27 NOTE — Progress Notes (Signed)
Pt CBG is 50.  Hypoglycemic protocol initiated.  Dr Kirke Corin notified

## 2021-02-27 NOTE — Progress Notes (Signed)
===  View-only below this line=== >> [facility], [facility]   Fri Feb 27, 2021  5:58 AM Pt states she was instructed by Dr Kirke Corin to take her pump off last night because of low blood sugars in the am. She will reapply after procedure per instruction of Dr Kirke Corin.  Current CBG is 147

## 2021-02-27 NOTE — Interval H&P Note (Signed)
Cath Lab Visit (complete for each Cath Lab visit)  Clinical Evaluation Leading to the Procedure:   ACS: No.  Non-ACS:    Anginal Classification: CCS III  Anti-ischemic medical therapy: Minimal Therapy (1 class of medications)  Non-Invasive Test Results: No non-invasive testing performed  Prior CABG: No previous CABG      History and Physical Interval Note:  02/27/2021 10:47 AM  [name]  has presented today for surgery, with the diagnosis of angina.  The various methods of treatment have been discussed with the patient and family. After consideration of risks, benefits and other options for treatment, the patient has consented to  Procedure(s): RIGHT/LEFT HEART CATH AND CORONARY ANGIOGRAPHY (N/A) as a surgical intervention.  The patient's history has been reviewed, patient examined, no change in status, stable for surgery.  I have reviewed the patient's chart and labs.  Questions were answered to the patient's satisfaction.     Lorine Bears

## 2021-02-27 NOTE — Progress Notes (Signed)
Repeat CBG is 77. Additional 12.5 g dextrose given per protocol

## 2021-02-28 DIAGNOSIS — I2510 Atherosclerotic heart disease of native coronary artery without angina pectoris: Secondary | ICD-10-CM | POA: Diagnosis not present

## 2021-02-28 DIAGNOSIS — I272 Pulmonary hypertension, unspecified: Secondary | ICD-10-CM | POA: Diagnosis not present

## 2021-02-28 DIAGNOSIS — I25118 Atherosclerotic heart disease of native coronary artery with other forms of angina pectoris: Secondary | ICD-10-CM | POA: Diagnosis not present

## 2021-02-28 DIAGNOSIS — I110 Hypertensive heart disease with heart failure: Secondary | ICD-10-CM

## 2021-02-28 DIAGNOSIS — E119 Type 2 diabetes mellitus without complications: Secondary | ICD-10-CM | POA: Diagnosis not present

## 2021-02-28 LAB — GLUCOSE, CAPILLARY: Glucose-Capillary: 140 mg/dL — ABNORMAL HIGH (ref 70–99)

## 2021-02-28 LAB — BASIC METABOLIC PANEL
Anion gap: 6 (ref 5–15)
BUN: 12 mg/dL (ref 6–20)
CO2: 29 mmol/L (ref 22–32)
Calcium: 8.9 mg/dL (ref 8.9–10.3)
Chloride: 102 mmol/L (ref 98–111)
Creatinine, Ser: 0.92 mg/dL (ref 0.44–1.00)
GFR, Estimated: >60 mL/min (ref >60)
Glucose, Bld: 84 mg/dL (ref 70–99)
Potassium: 3.5 mmol/L (ref 3.5–5.1)
Sodium: 137 mmol/L (ref 135–145)

## 2021-02-28 LAB — CBC
HCT: 44.6 % (ref 36.0–46.0)
Hemoglobin: 14 g/dL (ref 12.0–15.0)
MCH: 25.8 pg — ABNORMAL LOW (ref 26.0–34.0)
MCHC: 31.4 g/dL (ref 30.0–36.0)
MCV: 82.3 fL (ref 80.0–100.0)
Platelets: 388 10*3/uL (ref 150–400)
RBC: 5.42 MIL/uL — ABNORMAL HIGH (ref 3.87–5.11)
RDW: 15.8 % — ABNORMAL HIGH (ref 11.5–15.5)
WBC: 16.2 10*3/uL — ABNORMAL HIGH (ref 4.0–10.5)
nRBC: 0 % (ref 0.0–0.2)

## 2021-02-28 MED ORDER — AMLODIPINE BESYLATE 5 MG PO TABS: 5.0000 mg | ORAL_TABLET | Freq: Every day | ORAL | 6 refills | Status: DC

## 2021-02-28 MED ORDER — CLOPIDOGREL BISULFATE 75 MG PO TABS: 75.0000 mg | ORAL_TABLET | Freq: Every day | ORAL | 3 refills | Status: DC

## 2021-02-28 NOTE — Progress Notes (Signed)
CARDIAC REHAB PHASE I   PRE:  Rate/Rhythm: 55 SR  BP:  Supine: 144/55  Sitting:   Standing:    SaO2: 96 RA  MODE:  Ambulation: 50 ft   POST:  Rate/Rhythm: 65 SR  BP:  Supine: 179/59  Sitting:   Standing:    SaO2: 96 RA  Pt tolerated exercise well and amb 32ft with standby assist and personal cane. Pt had no CP or SOB, pt did have left leg pain that she states is normal. Education given to pt and husband on radial restriction, heart healthy diet, and adherence to NTG, aspirin and Plavix. Pt was also given exercise guidelines as tolerated with left leg pain and will refer to cardiac rehab phase 2. Pt is enrolled in PT at [facility] in [place]. Pt and husband verbalized understanding and pt was left in bed with call bell in reach   [id number] Harrie Jeans ACSM-EP 02/28/2021 10:33 AM

## 2021-02-28 NOTE — Discharge Summary (Signed)
Discharge Summary    Patient ID: [name] MRN: [id number]; DOB: 09/01/1966  Admit date: 02/27/2021 Discharge date: 02/28/2021  PCP:  Joaquin MusicHarless, Emily, NP   [facility]  Cardiologist:  Lorine BearsMuhammad Arida, MD    Discharge Diagnoses    Principal Problem:   Stable angina [facility](HCC) Active Problems:   Hypertensive heart disease with heart failure (HCC)   CAD (coronary artery disease)   HLD (hyperlipidemia)   Tobacco abuse   Pulmonary hypertension, unspecified (HCC)    Diagnostic Studies/Procedures     INTRAVASCULAR IMAGING/OCT  RIGHT/LEFT HEART CATH AND CORONARY ANGIOGRAPHY    Conclusion    The left ventricular systolic function is normal.  LV end diastolic pressure is moderately elevated.  The left ventricular ejection fraction is 55-65% by visual estimate.  Prox RCA lesion is 70% stenosed.  Post intervention, there is a 0% residual stenosis.  A drug-eluting stent was successfully placed using a STENT RESOLUTE ONYX 3.5X34.  Dist RCA lesion is 50% stenosed.  RPDA lesion is 60% stenosed.  Mid LM to Dist LM lesion is 30% stenosed.  Mid LAD lesion is 30% stenosed.  1st Diag lesion is 30% stenosed.   1.  Significant one-vessel coronary artery disease due to in-stent restenosis in the proximal right coronary artery that was significant by fractional flow reserve evaluation.  There is also moderate distal RCA disease and right PDA disease.  There is mild left main and LAD disease. 2.  Normal LV systolic function. 3.  Right heart catheterization showed moderately elevated filling pressures in the setting of severe hypertension, mild to moderate pulmonary hypertension and normal cardiac output. 4.  Successful OCT guided drug-eluting stent placement to the right coronary in-stent restenosis.  Recommendations: Discontinue small dose Xarelto and continue dual antiplatelet therapy for at least 6 months. Aggressive treatment of risk  factors. Continue oral furosemide and will have to work on controlling her blood pressure which seems to be leading to significant diastolic heart failure.  I added amlodipine to her medications.  Given elevated blood pressure, I am going to observe her overnight.  Diagnostic Dominance: Right    Intervention       History of Present Illness     Christine NuttingDenise L [name] is a 55 y.o. female with coronary artery disease, chronic pain secondary to reflex sympathetic dystrophy, diabetes mellitus, hypertension, hyperlipidemia, chronic diastolic congestive heart failure and tobacco abuse presented for outpatient cardiac catheterization and admitted.  History of CAD status post inferior MI 06/2009 status post angioplasty and bare-metal stent to RCA.  Echocardiogram December 2018 with normal LV function but diastolic dysfunction.  Seen by Dr. Kirke CorinArida February 10, 2021 and noted significant exertional dyspnea with occasional substernal chest tightness.  Associated with fatigue and sweating.  Nonconclusive pulmonary work-up.  Given symptoms concerning for angina recommended cardiac cath.   Hospital Course     Consultants: None  1. CAD - Cath showed significant one-vessel coronary artery disease due to in-stent restenosis in the proximal right coronary artery that was significant by fractional flow reserve evaluation.  There is also moderate distal RCA disease and right PDA disease.  There is mild left main and LAD disease. Now s/p Successful OCT guided drug-eluting stent placement to the right coronary in-stent restenosis.  -No overnight complication.  Cath site stable.  Ambulated well. -Plan for dual antiplatelet therapy for at least 6 months.  Discontinued small dose of Xarelto. -Recommended aggressive risk modification.  2. Uncontrolled HTN with chronic diastolic CHF - Right  heart catheterization showed moderately elevated filling pressures in the setting of severe hypertension, mild to moderate pulmonary  hypertension and normal cardiac output. - Added Amlodipine  - Continue home dose of Coreg 12.5 mg twice daily, Benicar 40mg  daily and Lasix 20 mg twice daily - Monitor BP at home and bring reading form review during follow up  3. Tobacco smoking - Cessation recommended   4. HTN - No results found for requested labs within last 8760 hours.  - Continue statin     Did the patient have an acute coronary syndrome (MI, NSTEMI, STEMI, etc) this admission?:  No                               Did the patient have a percutaneous coronary intervention (stent / angioplasty)?:  Yes.     Cath/PCI Registry Performance & Quality Measures: 1. Aspirin prescribed? - Yes 2. ADP Receptor Inhibitor (Plavix/Clopidogrel, Brilinta/Ticagrelor or Effient/Prasugrel) prescribed (includes medically managed patients)? - Yes 3. High Intensity Statin (Lipitor 40-80mg  or Crestor 20-40mg ) prescribed? - Yes 4. For EF <40%, was ACEI/ARB prescribed? - Yes 5. For EF <40%, Aldosterone Antagonist (Spironolactone or Eplerenone) prescribed? - Not Applicable (EF >/= 40%) 6. Cardiac Rehab Phase II ordered? - Yes       _____________  Discharge Vitals Blood pressure (!) 186/60, pulse (!) 58, temperature 97.8 F (36.6 C), temperature source Oral, resp. rate 12, height 4\' 10"  (1.473 m), weight 85.6 kg, SpO2 97 %.  Filed Weights   02/27/21 0605 02/28/21 0534  Weight: 85.3 kg 85.6 kg    Labs & Radiologic Studies    CBC Recent Labs    02/27/21 1133 02/28/21 0128  WBC  --  16.2*  HGB 14.6 14.0  HCT 43.0 44.6  MCV  --  82.3  PLT  --  388   Basic Metabolic Panel Recent Labs    03/01/21 1108 02/27/21 1133 02/28/21 0128  NA 141  140 124* 137  K 4.0  4.0 3.0* 3.5  CL 100  --  102  CO2  --   --  29  GLUCOSE 105*  --  84  BUN 11  --  12  CREATININE 0.80  --  0.92  CALCIUM  --   --  8.9   _____________  CARDIAC CATHETERIZATION  Result Date: 02/27/2021  The left ventricular systolic function is normal.  LV  end diastolic pressure is moderately elevated.  The left ventricular ejection fraction is 55-65% by visual estimate.  Prox RCA lesion is 70% stenosed.  Post intervention, there is a 0% residual stenosis.  A drug-eluting stent was successfully placed using a STENT RESOLUTE ONYX 3.5X34.  Dist RCA lesion is 50% stenosed.  RPDA lesion is 60% stenosed.  Mid LM to Dist LM lesion is 30% stenosed.  Mid LAD lesion is 30% stenosed.  1st Diag lesion is 30% stenosed.  1.  Significant one-vessel coronary artery disease due to in-stent restenosis in the proximal right coronary artery that was significant by fractional flow reserve evaluation.  There is also moderate distal RCA disease and right PDA disease.  There is mild left main and LAD disease. 2.  Normal LV systolic function. 3.  Right heart catheterization showed moderately elevated filling pressures in the setting of severe hypertension, mild to moderate pulmonary hypertension and normal cardiac output. 4.  Successful OCT guided drug-eluting stent placement to the right coronary in-stent restenosis. Recommendations: Discontinue small dose Xarelto  and continue dual antiplatelet therapy for at least 6 months. Aggressive treatment of risk factors. Continue oral furosemide and will have to work on controlling her blood pressure which seems to be leading to significant diastolic heart failure.  I added amlodipine to her medications.  Given elevated blood pressure, I am going to observe her overnight.   Disposition   Pt is being discharged home today in good condition.  Follow-up Plans & Appointments     Follow-up Information    Iran Ouch, MD. Go on 03/24/2021.   Specialty: Cardiology Why: @8 :40am for cath follow up  Contact information: [address] [place] [phone]              Discharge Instructions    AMB Referral to Cardiac Rehabilitation - Phase II   Complete by: As directed    Diagnosis:  Coronary  Stents Stable Angina     After initial evaluation and assessments completed: Virtual Based Care may be provided alone or in conjunction with Phase 2 Cardiac Rehab based on patient barriers.: Yes   Diet - low sodium heart healthy   Complete by: As directed    Discharge instructions   Complete by: As directed    No driving for 3 days. No lifting over 5 lbs for 1 week. No sexual activity for 1 week. You may return to work on 03/04/2021. Keep procedure site clean & dry. If you notice increased pain, swelling, bleeding or pus, call/return!  You may shower, but no soaking baths/hot tubs/pools for 1 week.   Monitor BP at home and bring reading form review during follow up   Increase activity slowly   Complete by: As directed       Discharge Medications   Allergies as of 02/28/2021   No Known Allergies     Medication List    STOP taking these medications   Xarelto 2.5 MG Tabs tablet Generic drug: rivaroxaban     TAKE these medications   amLODipine 5 MG tablet Commonly known as: NORVASC Take 1 tablet (5 mg total) by mouth daily. Start taking on: March 01, 2021   aspirin 81 MG tablet Take 81 mg by mouth daily.   atorvastatin 80 MG tablet Commonly known as: LIPITOR Take 1 tablet (80 mg total) by mouth every evening.   Breo Ellipta 100-25 MCG/INH Aepb Generic drug: fluticasone furoate-vilanterol Inhale 1 puff into the lungs daily.   carvedilol 12.5 MG tablet Commonly known as: COREG Take 12.5 mg by mouth 2 (two) times daily with a meal.   clopidogrel 75 MG tablet Commonly known as: PLAVIX Take 1 tablet (75 mg total) by mouth daily with breakfast. Start taking on: March 01, 2021   DULoxetine 60 MG capsule Commonly known as: CYMBALTA Take 60 mg by mouth daily.   Farxiga 10 MG Tabs tablet Generic drug: dapagliflozin propanediol Take 10 mg by mouth daily.   fentaNYL 25 MCG/HR Commonly known as: DURAGESIC Place 1 patch onto the skin every other day.   furosemide 20 MG  tablet Commonly known as: LASIX Take 20 mg by mouth 2 (two) times daily.   glycopyrrolate 1 MG tablet Commonly known as: ROBINUL Take 1 mg by mouth daily as needed (sweating).   loratadine 10 MG tablet Commonly known as: CLARITIN Take 10 mg by mouth daily.   NovoLOG 100 UNIT/ML injection Generic drug: insulin aspart Inject into the skin See admin instructions. Insulin pump   olmesartan 40 MG tablet Commonly known as:  BENICAR Take 40 mg by mouth daily.   OmniPod 5 Pack Misc   Oxycodone HCl 10 MG Tabs Take 10 mg by mouth 3 (three) times daily.   pantoprazole 40 MG tablet Commonly known as: PROTONIX Take 40 mg by mouth daily.   vitamin B-12 50 MCG tablet Commonly known as: CYANOCOBALAMIN Take 50 mcg by mouth daily.   Vitamin D (Ergocalciferol) 1.25 MG (50000 UNIT) Caps capsule Commonly known as: DRISDOL Take 50,000 Units by mouth 2 (two) times a week.          Outstanding Labs/Studies   None  Duration of Discharge Encounter   Greater than 30 minutes including physician time.  SignedManson Passey, PA 02/28/2021, 8:30 AM

## 2021-02-28 NOTE — Progress Notes (Signed)
Progress Note  Patient Name: [name] Date of Encounter: 02/28/2021  [facility] Cardiologist: Dr Kirke Corin  Subjective   No CP or dyspnea  Inpatient Medications    Scheduled Meds: . amLODipine  5 mg Oral Daily  . aspirin EC  81 mg Oral Daily  . atorvastatin  80 mg Oral QPM  . carvedilol  12.5 mg Oral BID WC  . clopidogrel  75 mg Oral Q breakfast  . dapagliflozin propanediol  10 mg Oral Daily  . DULoxetine  60 mg Oral Daily  . fentaNYL  1 patch Transdermal Q48H  . fluticasone furoate-vilanterol  1 puff Inhalation Daily  . furosemide  20 mg Oral BID  . irbesartan  300 mg Oral Daily  . oxyCODONE  10 mg Oral TID  . pantoprazole  40 mg Oral Daily  . sodium chloride flush  3 mL Intravenous Q12H  . vitamin B-12  50 mcg Oral Daily  . [START ON 03/02/2021] Vitamin D (Ergocalciferol)  50,000 Units Oral Once per day on Mon Thu   Continuous Infusions: . sodium chloride     PRN Meds: sodium chloride, acetaminophen, ondansetron (ZOFRAN) IV, sodium chloride flush   Vital Signs    Vitals:   02/28/21 0039 02/28/21 0534 02/28/21 0744 02/28/21 0754  BP: (!) 146/55 (!) 153/60  (!) 186/60  Pulse: (!) 53 (!) 56 (!) 57 (!) 58  Resp: 13 (!) 9  12  Temp: 98.9 F (37.2 C) 98.8 F (37.1 C)  97.8 F (36.6 C)  TempSrc: Oral Oral  Oral  SpO2: 96% 94%  97%  Weight:  85.6 kg    Height:        Intake/Output Summary (Last 24 hours) at 02/28/2021 0759 Last data filed at 02/27/2021 2200 Gross per 24 hour  Intake 120 ml  Output --  Net 120 ml   Last 3 Weights 02/28/2021 02/27/2021 02/10/2021  Weight (lbs) 188 lb 12.8 oz 188 lb 188 lb 12.8 oz  Weight (kg) 85.639 kg 85.276 kg 85.639 kg      Telemetry    Sinus with PVC - Personally Reviewed  Physical Exam   GEN: No acute distress.   Neck: No JVD Cardiac: RRR, no murmurs, rubs, or gallops.  Respiratory: Clear to auscultation bilaterally. GI: Soft, nontender, non-distended  MS: No edema, radial cath site with no hematoma Neuro:   Nonfocal  Psych: Normal affect   Labs      Chemistry Recent Labs  Lab 02/27/21 1108 02/27/21 1133 02/28/21 0128  NA 141  140 124* 137  K 4.0  4.0 3.0* 3.5  CL 100  --  102  CO2  --   --  29  GLUCOSE 105*  --  84  BUN 11  --  12  CREATININE 0.80  --  0.92  CALCIUM  --   --  8.9  GFRNONAA  --   --  >60  ANIONGAP  --   --  6     Hematology Recent Labs  Lab 02/27/21 1108 02/27/21 1133 02/28/21 0128  WBC  --   --  16.2*  RBC  --   --  5.42*  HGB 15.6*  15.0 14.6 14.0  HCT 46.0  44.0 43.0 44.6  MCV  --   --  82.3  MCH  --   --  25.8*  MCHC  --   --  31.4  RDW  --   --  15.8*  PLT  --   --  388    Radiology    CARDIAC CATHETERIZATION  Result Date: 02/27/2021  The left ventricular systolic function is normal.  LV end diastolic pressure is moderately elevated.  The left ventricular ejection fraction is 55-65% by visual estimate.  Prox RCA lesion is 70% stenosed.  Post intervention, there is a 0% residual stenosis.  A drug-eluting stent was successfully placed using a STENT RESOLUTE ONYX 3.5X34.  Dist RCA lesion is 50% stenosed.  RPDA lesion is 60% stenosed.  Mid LM to Dist LM lesion is 30% stenosed.  Mid LAD lesion is 30% stenosed.  1st Diag lesion is 30% stenosed.  1.  Significant one-vessel coronary artery disease due to in-stent restenosis in the proximal right coronary artery that was significant by fractional flow reserve evaluation.  There is also moderate distal RCA disease and right PDA disease.  There is mild left main and LAD disease. 2.  Normal LV systolic function. 3.  Right heart catheterization showed moderately elevated filling pressures in the setting of severe hypertension, mild to moderate pulmonary hypertension and normal cardiac output. 4.  Successful OCT guided drug-eluting stent placement to the right coronary in-stent restenosis. Recommendations: Discontinue small dose Xarelto and continue dual antiplatelet therapy for at least 6 months.  Aggressive treatment of risk factors. Continue oral furosemide and will have to work on controlling her blood pressure which seems to be leading to significant diastolic heart failure.  I added amlodipine to her medications.  Given elevated blood pressure, I am going to observe her overnight.    Patient Profile     55 y.o. female with past medical history of coronary artery disease, chronic pain secondary to reflex sympathetic dystrophy, diabetes mellitus, hypertension, hyperlipidemia, chronic diastolic congestive heart failure, tobacco abuse admitted following PCI of right coronary artery.  Had cardiac catheterization due to progressive unstable angina.  This revealed normal LV function, 70% proximal right coronary artery, 50% distal right coronary artery, 60% PDA, 30% left main, 30% LAD and 30% first diagonal.  Had PCI of the right coronary artery with drug-eluting stent.  Assessment & Plan    1 coronary artery disease-patient doing well status post PCI of the right coronary artery.  Continue aspirin, Plavix and statin.  2 hypertension-patient's blood pressure is elevated this morning.  We will continue preadmission medications and amlodipine 5 mg daily was added yesterday.  I have asked her to follow her blood pressure at home and medications can be advanced as needed.  3 chronic diastolic congestive heart failure-she appears to be euvolemic.  Continue home dose of Lasix.  Needs fluid restriction and low-sodium diet.  4 hyperlipidemia-continue statin.  5 tobacco abuse-needs cessation.  Plan discharge today.  Continue present medications.  Follow-up with Dr. Kirke Corin in [place] in approximately 4 weeks.  Note Xarelto was discontinued.  >30 min PA and physician time D2  For questions or updates, please contact [facility] Please consult [email or web address] for contact info under        Signed, Olga Millers, MD  02/28/2021, 7:59 AM

## 2021-03-02 MED FILL — Nitroglycerin IV Soln 100 MCG/ML in D5W: INTRA_ARTERIAL | Qty: 10 | Status: AC

## 2021-03-04 ENCOUNTER — Telehealth (HOSPITAL_COMMUNITY): Payer: Self-pay

## 2021-03-04 NOTE — Telephone Encounter (Signed)
Per phase 1, fax pt cardiac rehab referral to [place]. 

## 2021-03-23 ENCOUNTER — Telehealth: Payer: Self-pay | Admitting: Cardiovascular Disease

## 2021-03-23 NOTE — Telephone Encounter (Signed)
Per [facility] Course :  reschedule if he is feeling well and has no issues   If she does not have any cardiac issues such as chest pain, push the appt back by 3 weeks. If she does have any cardiac issues, then change to virtual please    if she does not have cardiac issue and has to be rescheduled, please emphasize on the importance of taking aspirin and plavix everyday religiously to protect her newly placed stent

## 2021-03-23 NOTE — Telephone Encounter (Signed)
    COVID-19 Pre-Screening Questions V2.:  . In the past 7 to 10 days have you had a cough,  shortness of breath, headache, congestion, fever (100 or greater) body aches, chills, sore throat, or sudden loss of taste or sense of smell,?  YES . Have you been around anyone with known Covid 19, or who is waiting for a Covid test result and is symptomatic? YES   For patients who are Covid+, pending results or exposed in the last 5 days WITH SYMPTOMS:  1.       Offer to change appointment to a [facility] appointment. If the patient declines, contact covering nurse or triage so it can be determined if patient needs to be seen or     rescheduled. (assumes patient calls ahead)  2.       If a patient presents in the lobby, ask them to return to their car and the nurse will call them. Obtain the correct cell phone number and message the covering nurse. The nurse will triage the patient and discuss with the provider to determine appropriate visit plan (In office, MyChart or reschedule).   3.       If it is determined the patient needs to be seen in the office, arrangements will be made for the patient to be seen in a remote room with minimal touches. (Location will be         specific to each [facility] site).               If you have any concerns/questions about symptoms patients report during screening (either on the phone or at threshold),                Send a secure chat with the patient's chart to the APP doing preop clearances for that day.              If the decision is made to see the patient, document the following in the appt. note:  "cleared by APP's initials".                        If an APP is not available contact a member of the leadership team.   Patients who are Covid+ are allowed in the office when the following are met: 1. No symptoms or improving symptoms  2. At least 5 days post positive test

## 2021-03-23 NOTE — Telephone Encounter (Signed)
Patient son is covid positive and patient currently has symptoms.  Appt is tomorrow for recent cath fu with intervention.    Patient denies issues with cath site .  Patient also denies any cardiac issues at this time.    Fwd to APP staff to advise on appt directions.

## 2021-03-23 NOTE — Telephone Encounter (Signed)
Spoke with Patient She again denies cardiac symptoms such as cp and denies issues with cath site.    Patient reports she is currently taking plavix and asa and advised verbatim of instructions below.  Patient verbalized understanding of below and will call in the interim if she has questions or concerns.   Appt rescheduled to 5/17 .

## 2021-03-24 ENCOUNTER — Ambulatory Visit: Payer: BC Managed Care – PPO | Admitting: Cardiovascular Disease

## 2021-04-06 DIAGNOSIS — E1142 Type 2 diabetes mellitus with diabetic polyneuropathy: Secondary | ICD-10-CM | POA: Diagnosis not present

## 2021-04-06 DIAGNOSIS — M47817 Spondylosis without myelopathy or radiculopathy, lumbosacral region: Secondary | ICD-10-CM | POA: Diagnosis not present

## 2021-04-06 DIAGNOSIS — Z79891 Long term (current) use of opiate analgesic: Secondary | ICD-10-CM | POA: Diagnosis not present

## 2021-04-06 DIAGNOSIS — G894 Chronic pain syndrome: Secondary | ICD-10-CM | POA: Diagnosis not present

## 2021-04-06 DIAGNOSIS — M5417 Radiculopathy, lumbosacral region: Secondary | ICD-10-CM | POA: Diagnosis not present

## 2021-04-21 ENCOUNTER — Encounter: Payer: Self-pay | Admitting: Cardiovascular Disease

## 2021-04-21 ENCOUNTER — Other Ambulatory Visit: Payer: Self-pay

## 2021-04-21 ENCOUNTER — Ambulatory Visit (INDEPENDENT_AMBULATORY_CARE_PROVIDER_SITE_OTHER): Payer: BC Managed Care – PPO | Admitting: Cardiovascular Disease

## 2021-04-21 VITALS — BP 126/60 | HR 56 | Ht <= 58 in | Wt 189.2 lb

## 2021-04-21 DIAGNOSIS — I10 Essential (primary) hypertension: Secondary | ICD-10-CM | POA: Diagnosis not present

## 2021-04-21 DIAGNOSIS — E785 Hyperlipidemia, unspecified: Secondary | ICD-10-CM | POA: Diagnosis not present

## 2021-04-21 DIAGNOSIS — I2510 Atherosclerotic heart disease of native coronary artery without angina pectoris: Secondary | ICD-10-CM | POA: Diagnosis not present

## 2021-04-21 DIAGNOSIS — I5032 Chronic diastolic (congestive) heart failure: Secondary | ICD-10-CM

## 2021-04-21 DIAGNOSIS — Z720 Tobacco use: Secondary | ICD-10-CM

## 2021-04-21 MED ORDER — CLOPIDOGREL BISULFATE 75 MG PO TABS: 75.0000 mg | ORAL_TABLET | Freq: Every day | ORAL | 3 refills | Status: DC

## 2021-04-21 MED ORDER — AMLODIPINE BESYLATE 5 MG PO TABS: 5.0000 mg | ORAL_TABLET | Freq: Every day | ORAL | 1 refills | Status: DC

## 2021-04-21 NOTE — Patient Instructions (Signed)

## 2021-04-21 NOTE — Progress Notes (Signed)
Cardiology Office Note   Date:  04/21/2021   ID:  [name], DOB 24-Feb-1966, MRN [id number]  PCP:  Joaquin Music, NP  Cardiologist:   Lorine Bears, MD   No chief complaint on file.     History of Present Illness: [name] is a 55 y.o. female who presents for a followup visit regarding coronary artery disease. She is status post inferior myocardial infarction in July 2010. At that time she had an angioplasty and bare-metal stent placement to the right coronary artery.  Most recent stress test was in August of 2014  showed no evidence of ischemia with normal ejection fraction.  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 She is on longterm Fentanyl patch. She is on insulin pump for diabetes. Echocardiogram in December 2018 showed normal LV systolic function but there was evidence of diastolic dysfunction with elevated filling pressures.  Lower extremity arterial Doppler in 12/2017 showed normal ABI bilaterally. She continues to take small dose Xarelto which was prescribed by her primary care physician.  She was seen in March for worsening chest pain and shortness of breath with exertion. I proceeded with a right and left cardiac catheterization on March 25 which showed significant one-vessel coronary artery disease due to significant in-stent restenosis in the proximal right coronary artery, moderate distal RCA disease and right PDA disease and mild left main and LAD disease.  Ejection fraction was normal.  Right heart catheterization showed moderately elevated filling pressures in the setting of severe hypertension, mild to moderate pulmonary hypertension and normal cardiac output.  I performed successful drug-eluting stent placement to the right coronary artery for in-stent restenosis. I added amlodipine for blood pressure control.  The patient had significant improvement in symptoms but unfortunately she developed COVID infection in April and it  took her a few weeks to recover from that.  She is feeling better now.  No chest pain.  Dyspnea improved.   Past Medical History:  Diagnosis Date  . CAD (coronary artery disease)    s/p MI & stenting  . DM2 (diabetes mellitus, type 2) (HCC)   . HLD (hyperlipidemia)   . HTN (hypertension)   . MI (myocardial infarction) (HCC)   . Sympathetic reflex dystrophy   . Tobacco abuse     Past Surgical History:  Procedure Laterality Date  . CARDIAC CATHETERIZATION  06/05/2009   Inferior MI. Severe 1 vessel CAD (RCA)  . CARPAL TUNNEL RELEASE  2002   left  . CHOLECYSTECTOMY  2006  . CORONARY ANGIOPLASTY WITH STENT PLACEMENT  06/05/2009   BMS (4.0 X 23 mm) Vision stent to RCA  . CORONARY STENT PLACEMENT  2010  . CORONARY STENT PLACEMENT  02/27/2021  . DILATION AND CURETTAGE OF UTERUS  1992 & 2004  . INTRAVASCULAR IMAGING/OCT N/A 02/27/2021   Procedure: INTRAVASCULAR IMAGING/OCT;  Surgeon: Iran Ouch, MD;  Location: [facility];  Service: Cardiovascular;  Laterality: N/A;  . RIGHT/LEFT HEART CATH AND CORONARY ANGIOGRAPHY N/A 02/27/2021   Procedure: RIGHT/LEFT HEART CATH AND CORONARY ANGIOGRAPHY;  Surgeon: Iran Ouch, MD;  Location: [facility];  Service: Cardiovascular;  Laterality: N/A;  . TONSILLECTOMY  1971  . TUBAL LIGATION  2005     Current Outpatient Medications  Medication Sig Dispense Refill  . amLODipine (NORVASC) 5 MG tablet Take 1 tablet (5 mg total) by mouth daily. 30 tablet 6  . aspirin 81 MG tablet Take 81 mg by mouth daily.    Marland Kitchen  atorvastatin (LIPITOR) 80 MG tablet Take 1 tablet (80 mg total) by mouth every evening. 30 tablet 6  . carvedilol (COREG) 12.5 MG tablet Take 12.5 mg by mouth 2 (two) times daily with a meal.  1  . clopidogrel (PLAVIX) 75 MG tablet Take 1 tablet (75 mg total) by mouth daily with breakfast. 90 tablet 3  . Continuous Blood Gluc Receiver (DEXCOM G6 RECEIVER) DEVI See admin instructions.    . Continuous Blood Gluc Sensor  (DEXCOM G6 SENSOR) MISC See admin instructions.    . Continuous Blood Gluc Transmit (DEXCOM G6 TRANSMITTER) MISC See admin instructions.    . DULoxetine (CYMBALTA) 60 MG capsule Take 60 mg by mouth daily.    Marland Kitchen FARXIGA 10 MG TABS tablet Take 10 mg by mouth daily.    . fentaNYL (DURAGESIC) 25 MCG/HR Place 1 patch onto the skin every other day.    . fluticasone furoate-vilanterol (BREO ELLIPTA) 100-25 MCG/INH AEPB Inhale 1 puff into the lungs daily.    . furosemide (LASIX) 20 MG tablet Take 20 mg by mouth 2 (two) times daily.    Marland Kitchen glycopyrrolate (ROBINUL) 1 MG tablet Take 1 mg by mouth daily as needed (sweating).    . Insulin Disposable Pump (OMNIPOD 5 PACK) MISC     . loratadine (CLARITIN) 10 MG tablet Take 10 mg by mouth daily.    Marland Kitchen olmesartan (BENICAR) 40 MG tablet Take 40 mg by mouth daily.    . Oxycodone HCl 10 MG TABS Take 10 mg by mouth 3 (three) times daily.    . pantoprazole (PROTONIX) 40 MG tablet Take 40 mg by mouth daily.    . vitamin B-12 (CYANOCOBALAMIN) 50 MCG tablet Take 50 mcg by mouth daily.    . Vitamin D, Ergocalciferol, (DRISDOL) 1.25 MG (50000 UNIT) CAPS capsule Take 50,000 Units by mouth 2 (two) times a week.     No current facility-administered medications for this visit.    Allergies:   Patient has no known allergies.    Social History:  The patient  reports that she has been smoking. She has a 20.00 pack-year smoking history. She has never used smokeless tobacco. She reports that she does not drink alcohol and does not use drugs.   Family History:  The patient's family history includes Arrhythmia in her father; Huntington's disease in her mother and another family member; Hyperlipidemia in her father; Hypertension in her father.    ROS:  Please see the history of present illness.   Otherwise, review of systems are positive for none.   All other systems are reviewed and negative.    PHYSICAL EXAM: VS:  BP 126/60   Pulse (!) 56   Ht 4\' 10"  (1.473 m)   Wt 189 lb  3.2 oz (85.8 kg)   SpO2 99%   BMI 39.54 kg/m  , BMI Body mass index is 39.54 kg/m. GEN: Well nourished, well developed, in no acute distress  HEENT: normal  Neck: no JVD, carotid bruits, or masses Cardiac: RRR; no  rubs, or gallops,no edema . There is 1/ of 6 systolic ejection murmur in the aortic area and Respiratory:  clear to auscultation bilaterally, normal work of breathing GI: soft, nontender, nondistended, + BS MS: no deformity or atrophy  Skin: warm and dry, no rash Neuro:  Strength and sensation are intact Psych: euthymic mood, full affect Right radial pulses normal with no hematoma.   EKG:  EKG is ordered today. The ekg ordered today demonstrates sinus bradycardia .  No significant ST or T wave changes.   Recent Labs: 02/28/2021: BUN 12; Creatinine, Ser 0.92; Hemoglobin 14.0; Platelets 388; Potassium 3.5; Sodium 137    Lipid Panel No results found for: CHOL, TRIG, HDL, CHOLHDL, VLDL, LDLCALC, LDLDIRECT    Wt Readings from Last 3 Encounters:  04/21/21 189 lb 3.2 oz (85.8 kg)  02/28/21 188 lb 12.8 oz (85.6 kg)  02/10/21 188 lb 12.8 oz (85.6 kg)      Other studies Reviewed:   ASSESSMENT AND PLAN:  1.  Coronary artery disease involving native coronary arteries without angina: Status post recent drug-eluting stent placement for in-stent restenosis of the right coronary artery.  She is doing significantly better.  Continue dual antiplatelet therapy.  I encouraged her to start cardiac rehab.    2.   Chronic diastolic heart failure: She appears to be euvolemic on current dose of furosemide.  Her filling pressures were moderately elevated during right heart catheterization but her blood pressure was elevated at that time.  Since then, blood pressure improved.  3. Essential hypertension: Blood pressure is well controlled on current medication.  3. Hyperlipidemia: Continue high dose atorvastatin.  Recommend a target LDL of less than 70.  4. Tobacco use: I discussed  with her the importance of smoking cessation.  She cut down on her own to a half a pack to three fourths of a pack daily.   Disposition:   FU with me in 6 months.  Signed,  Lorine Bears, MD  04/21/2021 8:59 AM    [facility]

## 2021-10-27 ENCOUNTER — Other Ambulatory Visit: Payer: Self-pay

## 2021-10-27 ENCOUNTER — Encounter: Payer: Self-pay | Admitting: Cardiovascular Disease

## 2021-10-27 ENCOUNTER — Ambulatory Visit (INDEPENDENT_AMBULATORY_CARE_PROVIDER_SITE_OTHER): Payer: BC Managed Care – PPO | Admitting: Cardiovascular Disease

## 2021-10-27 VITALS — BP 119/58 | HR 55 | Ht <= 58 in | Wt 186.4 lb

## 2021-10-27 DIAGNOSIS — I2510 Atherosclerotic heart disease of native coronary artery without angina pectoris: Secondary | ICD-10-CM | POA: Diagnosis not present

## 2021-10-27 DIAGNOSIS — I5032 Chronic diastolic (congestive) heart failure: Secondary | ICD-10-CM

## 2021-10-27 DIAGNOSIS — Z720 Tobacco use: Secondary | ICD-10-CM | POA: Diagnosis not present

## 2021-10-27 DIAGNOSIS — E785 Hyperlipidemia, unspecified: Secondary | ICD-10-CM

## 2021-10-27 NOTE — Progress Notes (Signed)
Cardiology Office Note   Date:  10/28/2021   ID:  [name], DOB [birth year], MRN [id number]  PCP:  Joaquin Music, NP  Cardiologist:   Lorine Bears, MD   No chief complaint on file.     History of Present Illness: [name] is a 55 y.o. female who presents for a followup visit regarding coronary artery disease. She is status post inferior myocardial infarction in July 2010. At that time she had an angioplasty and bare-metal stent placement to the right coronary artery.  Most recent stress test was in August of 2014  showed no evidence of ischemia with normal ejection fraction.  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 She is on longterm Fentanyl patch. She is on insulin pump for diabetes. Echocardiogram in December 2018 showed normal LV systolic function but there was evidence of diastolic dysfunction with elevated filling pressures.  Lower extremity arterial Doppler in 12/2017 showed normal ABI bilaterally. She continues to take small dose Xarelto which was prescribed by her primary care physician.  She had recurrent angina and shortness of breath earlier this year.  A right and left cardiac catheterization was done in March which showed significant one-vessel coronary artery disease due to in-stent restenosis in the proximal right coronary artery, moderate distal RCA disease and right PDA disease and mild left main and LAD disease.  Ejection fraction was normal.  Right heart catheterization showed moderately elevated filling pressures in the setting of severe hypertension, mild to moderate pulmonary hypertension and normal cardiac output.  I performed successful drug-eluting stent placement to the right coronary artery for in-stent restenosis. I added amlodipine for blood pressure control.    She has been doing well with no recurrent chest pain or worsening dyspnea.  She has been dealing with recurrent upper respiratory tract infection  recently.  She cut down tobacco use to less than and 1 pack/day but has not been able to decrease any further.  She reports easy bruising with dual antiplatelet therapy but no bleeding issues.   Past Medical History:  Diagnosis Date   CAD (coronary artery disease)    s/p MI & stenting   DM2 (diabetes mellitus, type 2) (HCC)    HLD (hyperlipidemia)    HTN (hypertension)    MI (myocardial infarction) (HCC)    Sympathetic reflex dystrophy    Tobacco abuse     Past Surgical History:  Procedure Laterality Date   CARDIAC CATHETERIZATION  06/05/2009   Inferior MI. Severe 1 vessel CAD (RCA)   CARPAL TUNNEL RELEASE  2002   left   CHOLECYSTECTOMY  2006   CORONARY ANGIOPLASTY WITH STENT PLACEMENT  06/05/2009   BMS (4.0 X 23 mm) Vision stent to RCA   CORONARY STENT PLACEMENT  2010   CORONARY STENT PLACEMENT  02/27/2021   DILATION AND CURETTAGE OF UTERUS  1992 & 2004   INTRAVASCULAR IMAGING/OCT N/A 02/27/2021   Procedure: INTRAVASCULAR IMAGING/OCT;  Surgeon: Iran Ouch, MD;  Location: [facility];  Service: Cardiovascular;  Laterality: N/A;   RIGHT/LEFT HEART CATH AND CORONARY ANGIOGRAPHY N/A 02/27/2021   Procedure: RIGHT/LEFT HEART CATH AND CORONARY ANGIOGRAPHY;  Surgeon: Iran Ouch, MD;  Location: [facility];  Service: Cardiovascular;  Laterality: N/A;   TONSILLECTOMY  1971   TUBAL LIGATION  2005     Current Outpatient Medications  Medication Sig Dispense Refill   amLODipine (NORVASC) 5 MG tablet Take 1 tablet (5 mg total) by mouth daily.  90 tablet 1   aspirin 81 MG tablet Take 81 mg by mouth daily.     atorvastatin (LIPITOR) 80 MG tablet Take 1 tablet (80 mg total) by mouth every evening. 30 tablet 6   carvedilol (COREG) 12.5 MG tablet Take 12.5 mg by mouth 2 (two) times daily with a meal.  1   clopidogrel (PLAVIX) 75 MG tablet Take 1 tablet (75 mg total) by mouth daily with breakfast. 90 tablet 3   Continuous Blood Gluc Receiver (DEXCOM G6 RECEIVER) DEVI  See admin instructions.     Continuous Blood Gluc Sensor (DEXCOM G6 SENSOR) MISC See admin instructions.     Continuous Blood Gluc Transmit (DEXCOM G6 TRANSMITTER) MISC See admin instructions.     DULoxetine (CYMBALTA) 60 MG capsule Take 60 mg by mouth daily.     FARXIGA 10 MG TABS tablet Take 10 mg by mouth daily.     fentaNYL (DURAGESIC) 25 MCG/HR Place 1 patch onto the skin every other day.     fluticasone furoate-vilanterol (BREO ELLIPTA) 100-25 MCG/INH AEPB Inhale 1 puff into the lungs daily.     furosemide (LASIX) 20 MG tablet Take 20 mg by mouth 2 (two) times daily.     glycopyrrolate (ROBINUL) 1 MG tablet Take 1 mg by mouth daily as needed (sweating).     Insulin Disposable Pump (OMNIPOD 5 PACK) MISC      loratadine (CLARITIN) 10 MG tablet Take 10 mg by mouth daily.     olmesartan (BENICAR) 40 MG tablet Take 40 mg by mouth daily.     Oxycodone HCl 10 MG TABS Take 10 mg by mouth 3 (three) times daily.     pantoprazole (PROTONIX) 40 MG tablet Take 40 mg by mouth daily.     vitamin B-12 (CYANOCOBALAMIN) 50 MCG tablet Take 50 mcg by mouth daily.     Vitamin D, Ergocalciferol, (DRISDOL) 1.25 MG (50000 UNIT) CAPS capsule Take 50,000 Units by mouth 2 (two) times a week.     No current facility-administered medications for this visit.    Allergies:   Patient has no known allergies.    Social History:  The patient  reports that she has been smoking cigarettes. She has a 20.00 pack-year smoking history. She has never used smokeless tobacco. She reports that she does not drink alcohol and does not use drugs.   Family History:  The patient's family history includes Arrhythmia in her father; Huntington's disease in her mother and another family member; Hyperlipidemia in her father; Hypertension in her father.    ROS:  Please see the history of present illness.   Otherwise, review of systems are positive for none.   All other systems are reviewed and negative.    PHYSICAL EXAM: VS:  BP (!)  119/58   Pulse (!) 55   Ht 4\' 10"  (1.473 m)   Wt 186 lb 6.4 oz (84.6 kg)   SpO2 98%   BMI 38.96 kg/m  , BMI Body mass index is 38.96 kg/m. GEN: Well nourished, well developed, in no acute distress  HEENT: normal  Neck: no JVD, carotid bruits, or masses Cardiac: RRR; no  rubs, or gallops,no edema . There is 1/ of 6 systolic ejection murmur in the aortic area and Respiratory:  clear to auscultation bilaterally, normal work of breathing GI: soft, nontender, nondistended, + BS MS: no deformity or atrophy  Skin: warm and dry, no rash Neuro:  Strength and sensation are intact Psych: euthymic mood, full affect   EKG:  EKG is ordered today. The ekg ordered today demonstrates sinus bradycardia .  No significant ST or T wave changes.   Recent Labs: 02/28/2021: BUN 12; Creatinine, Ser 0.92; Hemoglobin 14.0; Platelets 388; Potassium 3.5; Sodium 137    Lipid Panel No results found for: CHOL, TRIG, HDL, CHOLHDL, VLDL, LDLCALC, LDLDIRECT    Wt Readings from Last 3 Encounters:  10/27/21 186 lb 6.4 oz (84.6 kg)  04/21/21 189 lb 3.2 oz (85.8 kg)  02/28/21 188 lb 12.8 oz (85.6 kg)      Other studies Reviewed:   ASSESSMENT AND PLAN:  1.  Coronary artery disease involving native coronary arteries without angina: Status post drug-eluting stent placement for in-stent restenosis of the right coronary artery.   I am planning to continue dual antiplatelet therapy long-term as tolerated.  2.   Chronic diastolic heart failure: She appears to be euvolemic on current dose of furosemide 20 mg twice daily.  She gets routine labs with her primary care physician.  3. Essential hypertension: Blood pressure improved significantly since the addition of amlodipine earlier this year.  3. Hyperlipidemia: Continue high dose atorvastatin.  Recommend a target LDL of less than 70.  4. Tobacco use: I discussed with her the importance of smoking cessation.  She cut down on her own to a half a pack to three  fourths of a pack daily.   Disposition:   FU with me in 12 months.  Signed,  Lorine Bears, MD  10/28/2021 5:59 PM    [facility]

## 2021-10-27 NOTE — Patient Instructions (Signed)
Medication Instructions:  Your physician recommends that you continue on your current medications as directed. Please refer to the Current Medication list given to you today.  *If you need a refill on your cardiac medications before your next appointment, please call your pharmacy*   Follow-Up: At [facility], you and your health needs are our priority.  As part of our continuing mission to provide you with exceptional heart care, we have created designated Provider Care Teams.  These Care Teams include your primary Cardiologist (physician) and Advanced Practice Providers (APPs -  Physician Assistants and Nurse Practitioners) who all work together to provide you with the care you need, when you need it.  We recommend signing up for the patient portal called "MyChart".  Sign up information is provided on this After Visit Summary.  MyChart is used to connect with patients for Virtual Visits (Telemedicine).  Patients are able to view lab/test results, encounter notes, upcoming appointments, etc.  Non-urgent messages can be sent to your provider as well.   To learn more about what you can do with MyChart, go to [email or web address].    Your next appointment:   12 month(s)  The format for your next appointment:   In Person  Provider:   Muhammad Arida, MD 

## 2022-05-10 ENCOUNTER — Other Ambulatory Visit: Payer: Self-pay | Admitting: Cardiovascular Disease

## 2022-05-18 ENCOUNTER — Ambulatory Visit (INDEPENDENT_AMBULATORY_CARE_PROVIDER_SITE_OTHER): Payer: BC Managed Care – PPO | Admitting: Cardiovascular Disease

## 2022-05-18 ENCOUNTER — Encounter: Payer: Self-pay | Admitting: Cardiovascular Disease

## 2022-05-18 VITALS — BP 148/40 | HR 88 | Ht <= 58 in | Wt 182.6 lb

## 2022-05-18 DIAGNOSIS — R0602 Shortness of breath: Secondary | ICD-10-CM | POA: Diagnosis not present

## 2022-05-18 DIAGNOSIS — I10 Essential (primary) hypertension: Secondary | ICD-10-CM

## 2022-05-18 DIAGNOSIS — E785 Hyperlipidemia, unspecified: Secondary | ICD-10-CM | POA: Diagnosis not present

## 2022-05-18 DIAGNOSIS — Z720 Tobacco use: Secondary | ICD-10-CM

## 2022-05-18 DIAGNOSIS — I5032 Chronic diastolic (congestive) heart failure: Secondary | ICD-10-CM

## 2022-05-18 DIAGNOSIS — I25118 Atherosclerotic heart disease of native coronary artery with other forms of angina pectoris: Secondary | ICD-10-CM | POA: Diagnosis not present

## 2022-05-18 MED ORDER — CARVEDILOL 12.5 MG PO TABS
12.5000 mg | ORAL_TABLET | Freq: Two times a day (BID) | ORAL | 3 refills | Status: DC
Start: 2022-05-18 — End: 2023-05-30

## 2022-05-18 NOTE — Progress Notes (Signed)
Cardiology Office Note   Date:  05/18/2022   ID:  ETANA BEETS, DOB 16-Oct-1966, MRN [id number]  PCP:  Joaquin Music, NP  Cardiologist:   Lorine Bears, MD   Chief Complaint  Patient presents with   Shortness of Breath   Chest Pain       History of Present Illness: [name] is a 56 y.o. female who presents for a followup visit regarding coronary artery disease. She is status post inferior myocardial infarction in July 2010. At that time she had an angioplasty and bare-metal stent placement to the right coronary artery.  Most recent stress test was in August of 2014  showed no evidence of ischemia with normal ejection fraction.  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 She is on longterm Fentanyl patch. She is on insulin pump for diabetes. Echocardiogram in December 2018 showed normal LV systolic function but there was evidence of diastolic dysfunction with elevated filling pressures.  Lower extremity arterial Doppler in 12/2017 showed normal ABI bilaterally. She continues to take small dose Xarelto which was prescribed by her primary care physician.  She had recurrent angina and shortness of breath in 2022.  A right and left cardiac catheterization was done in March which showed significant one-vessel coronary artery disease due to in-stent restenosis in the proximal right coronary artery, moderate distal RCA disease and right PDA disease and mild left main and LAD disease.  Ejection fraction was normal.  Right heart catheterization showed moderately elevated filling pressures in the setting of severe hypertension, mild to moderate pulmonary hypertension and normal cardiac output.  I performed successful drug-eluting stent placement to the right coronary artery for in-stent restenosis. I added amlodipine for blood pressure control.    She ran out of carvedilol 2 weeks ago.  Shortly after that, she started having substernal chest pain with  exertion that resolved with rest.  This is associated with shortness of breath.  Unfortunately, she continues to smoke 1 pack/day.   Past Medical History:  Diagnosis Date   CAD (coronary artery disease)    s/p MI & stenting   DM2 (diabetes mellitus, type 2) (HCC)    HLD (hyperlipidemia)    HTN (hypertension)    MI (myocardial infarction) (HCC)    Sympathetic reflex dystrophy    Tobacco abuse     Past Surgical History:  Procedure Laterality Date   CARDIAC CATHETERIZATION  06/05/2009   Inferior MI. Severe 1 vessel CAD (RCA)   CARPAL TUNNEL RELEASE  2002   left   CHOLECYSTECTOMY  2006   CORONARY ANGIOPLASTY WITH STENT PLACEMENT  06/05/2009   BMS (4.0 X 23 mm) Vision stent to RCA   CORONARY STENT PLACEMENT  2010   CORONARY STENT PLACEMENT  02/27/2021   DILATION AND CURETTAGE OF UTERUS  1992 & 2004   INTRAVASCULAR IMAGING/OCT N/A 02/27/2021   Procedure: INTRAVASCULAR IMAGING/OCT;  Surgeon: Iran Ouch, MD;  Location: [facility];  Service: Cardiovascular;  Laterality: N/A;   RIGHT/LEFT HEART CATH AND CORONARY ANGIOGRAPHY N/A 02/27/2021   Procedure: RIGHT/LEFT HEART CATH AND CORONARY ANGIOGRAPHY;  Surgeon: Iran Ouch, MD;  Location: [facility];  Service: Cardiovascular;  Laterality: N/A;   TONSILLECTOMY  1971   TUBAL LIGATION  2005     Current Outpatient Medications  Medication Sig Dispense Refill   amLODipine (NORVASC) 5 MG tablet Take 1 tablet (5 mg total) by mouth daily. 90 tablet 1   aspirin 81 MG  tablet Take 81 mg by mouth daily.     atorvastatin (LIPITOR) 80 MG tablet Take 1 tablet (80 mg total) by mouth every evening. 30 tablet 6   clopidogrel (PLAVIX) 75 MG tablet Take 1 tablet (75 mg total) by mouth daily with breakfast. 90 tablet 3   Continuous Blood Gluc Receiver (DEXCOM G6 RECEIVER) DEVI See admin instructions.     Continuous Blood Gluc Sensor (DEXCOM G6 SENSOR) MISC See admin instructions.     Continuous Blood Gluc Transmit (DEXCOM G6  TRANSMITTER) MISC See admin instructions.     DULoxetine (CYMBALTA) 60 MG capsule Take 60 mg by mouth daily.     FARXIGA 10 MG TABS tablet Take 10 mg by mouth daily.     fentaNYL (DURAGESIC) 25 MCG/HR Place 1 patch onto the skin every other day.     furosemide (LASIX) 20 MG tablet Take 20 mg by mouth 2 (two) times daily.     glycopyrrolate (ROBINUL) 1 MG tablet Take 1 mg by mouth daily as needed (sweating).     Insulin Disposable Pump (OMNIPOD 5 PACK) MISC      loratadine (CLARITIN) 10 MG tablet Take 10 mg by mouth daily.     olmesartan (BENICAR) 40 MG tablet Take 40 mg by mouth daily.     Oxycodone HCl 10 MG TABS Take 10 mg by mouth 3 (three) times daily.     pantoprazole (PROTONIX) 40 MG tablet Take 40 mg by mouth daily.     vitamin B-12 (CYANOCOBALAMIN) 50 MCG tablet Take 50 mcg by mouth daily.     Vitamin D, Ergocalciferol, (DRISDOL) 1.25 MG (50000 UNIT) CAPS capsule Take 50,000 Units by mouth 2 (two) times a week.     carvedilol (COREG) 12.5 MG tablet Take 1 tablet (12.5 mg total) by mouth 2 (two) times daily with a meal. 180 tablet 3   No current facility-administered medications for this visit.    Allergies:   Patient has no known allergies.    Social History:  The patient  reports that she has been smoking cigarettes. She has a 20.00 pack-year smoking history. She has never used smokeless tobacco. She reports that she does not drink alcohol and does not use drugs.   Family History:  The patient's family history includes Arrhythmia in her father; Huntington's disease in her mother and another family member; Hyperlipidemia in her father; Hypertension in her father.    ROS:  Please see the history of present illness.   Otherwise, review of systems are positive for none.   All other systems are reviewed and negative.    PHYSICAL EXAM: VS:  BP (!) 148/40   Pulse 88   Ht  (1.473 m)   Wt 182 lb 9.6 oz (82.8 kg)   SpO2 99%   BMI 38.16 kg/m  , BMI Body mass index is 38.16  kg/m. GEN: Well nourished, well developed, in no acute distress  HEENT: normal  Neck: no JVD, carotid bruits, or masses Cardiac: RRR; no  rubs, or gallops,no edema . There is 2/6 systolic ejection murmur in the aortic area and Respiratory:  clear to auscultation bilaterally, normal work of breathing GI: soft, nontender, nondistended, + BS MS: no deformity or atrophy  Skin: warm and dry, no rash Neuro:  Strength and sensation are intact Psych: euthymic mood, full affect   EKG:  EKG is ordered today. The ekg ordered today demonstrates sinus rhythm with a PVC.   Recent Labs: No results found for requested labs  within last 365 days.    Lipid Panel No results found for: "CHOL", "TRIG", "HDL", "CHOLHDL", "VLDL", "LDLCALC", "LDLDIRECT"    Wt Readings from Last 3 Encounters:  05/18/22 182 lb 9.6 oz (82.8 kg)  10/27/21 186 lb 6.4 oz (84.6 kg)  04/21/21 189 lb 3.2 oz (85.8 kg)      Other studies Reviewed:   ASSESSMENT AND PLAN:  1.  Coronary artery disease involving native coronary arteries with other forms of angina: The patient reports anginal symptoms and exertional chest pain since she ran out of carvedilol.  Her EKG does not show any ischemic changes.  I refilled her carvedilol today.  We will obtain an echocardiogram given significant shortness of breath.  Follow-up in 1 month.  If no improvement in anginal symptoms, recommend proceeding with left heart catheterization and possible PCI.  The patient is not able to go through a stress test given inability to exercise on a treadmill and previous reaction to Lexiscan.  Continue dual antiplatelet therapy.    2.   Chronic diastolic heart failure: She appears to be euvolemic on current dose of furosemide 20 mg twice daily.   3. Essential hypertension: Blood pressure is elevated but she ran out of carvedilol.  I refilled the medication today.  3. Hyperlipidemia: Continue high dose atorvastatin.  Recommend a target LDL of less than  70.  4. Tobacco use: I discussed with her the importance of smoking cessation.  Unfortunately, she is smoking 1 pack/day again.  She reports inability to quit.    Disposition:   FU with me in 1 months.  Signed,  Lorine Bears, MD  05/18/2022 10:09 AM    [facility]

## 2022-05-18 NOTE — Patient Instructions (Signed)
Medication Instructions:  No changes *If you need a refill on your cardiac medications before your next appointment, please call your pharmacy*   Lab Work: None ordered If you have labs (blood work) drawn today and your tests are completely normal, you will receive your results only by: MyChart Message (if you have MyChart) OR A paper copy in the mail If you have any lab test that is abnormal or we need to change your treatment, we will call you to review the results.   Testing/Procedures: Your physician has requested that you have an echocardiogram. Echocardiography is a painless test that uses sound waves to create images of your heart. It provides your doctor with information about the size and shape of your heart and how well your heart's chambers and valves are working. You may receive an ultrasound enhancing agent through an IV if needed to better visualize your heart during the echo.This procedure takes approximately one hour. There are no restrictions for this procedure. This will take place at the [facility] location    Follow-Up: At [facility], you and your health needs are our priority.  As part of our continuing mission to provide you with exceptional heart care, we have created designated Provider Care Teams.  These Care Teams include your primary Cardiologist (physician) and Advanced Practice Providers (APPs -  Physician Assistants and Nurse Practitioners) who all work together to provide you with the care you need, when you need it.  We recommend signing up for the patient portal called "MyChart".  Sign up information is provided on this After Visit Summary.  MyChart is used to connect with patients for Virtual Visits (Telemedicine).  Patients are able to view lab/test results, encounter notes, upcoming appointments, etc.  Non-urgent messages can be sent to your provider as well.   To learn more about what you can do with MyChart, go to [email or web address].    Your next  appointment:   1 month(s)  The format for your next appointment:   In Person  Provider:   Lorine Bears, MD {   Important Information About Sugar

## 2022-05-19 ENCOUNTER — Ambulatory Visit (INDEPENDENT_AMBULATORY_CARE_PROVIDER_SITE_OTHER): Payer: BC Managed Care – PPO

## 2022-05-19 DIAGNOSIS — R0602 Shortness of breath: Secondary | ICD-10-CM | POA: Diagnosis not present

## 2022-05-19 LAB — ECHOCARDIOGRAM COMPLETE
Area-P 1/2: 3.17 cm2
S' Lateral: 2.6 cm

## 2022-05-19 MED ORDER — PERFLUTREN LIPID MICROSPHERE
1.0000 mL | INTRAVENOUS | Status: AC | PRN
Start: 2022-05-19 — End: 2022-05-19
  Administered 2022-05-19: 2 mL via INTRAVENOUS

## 2022-05-21 ENCOUNTER — Telehealth: Payer: Self-pay | Admitting: Cardiovascular Disease

## 2022-05-21 NOTE — Telephone Encounter (Signed)
Christine Ouch, MD  05/19/2022  5:39 PM EDT     Inform patient that echo was fine.  Normal ejection fraction with evidence of diastolic dysfunction which might be causing some shortness of breath.  Continue same medications including furosemide.   Patient called w/results

## 2022-05-21 NOTE — Telephone Encounter (Signed)
Pt is returning a call about echo results

## 2022-06-29 ENCOUNTER — Ambulatory Visit: Payer: BC Managed Care – PPO | Admitting: Cardiovascular Disease

## 2022-08-03 ENCOUNTER — Encounter: Payer: Self-pay | Admitting: Cardiovascular Disease

## 2022-08-03 ENCOUNTER — Ambulatory Visit: Payer: BC Managed Care – PPO | Attending: Cardiovascular Disease | Admitting: Cardiovascular Disease

## 2022-08-03 VITALS — BP 126/56 | HR 59 | Ht <= 58 in | Wt 183.0 lb

## 2022-08-03 DIAGNOSIS — I5032 Chronic diastolic (congestive) heart failure: Secondary | ICD-10-CM | POA: Insufficient documentation

## 2022-08-03 DIAGNOSIS — I25118 Atherosclerotic heart disease of native coronary artery with other forms of angina pectoris: Secondary | ICD-10-CM | POA: Insufficient documentation

## 2022-08-03 DIAGNOSIS — I10 Essential (primary) hypertension: Secondary | ICD-10-CM | POA: Diagnosis not present

## 2022-08-03 DIAGNOSIS — Z720 Tobacco use: Secondary | ICD-10-CM | POA: Diagnosis present

## 2022-08-03 DIAGNOSIS — E785 Hyperlipidemia, unspecified: Secondary | ICD-10-CM | POA: Insufficient documentation

## 2022-08-03 NOTE — Progress Notes (Signed)
Cardiology Office Note   Date:  08/03/2022   ID:  [name], DOB 1966-10-16, MRN [id number]  PCP:  Runell Gess, MD  Cardiologist:   Lorine Bears, MD   Chief Complaint  Patient presents with   Follow-up    1 month.       History of Present Illness: [name] is a 56 y.o. female who presents for a followup visit regarding coronary artery disease. She is status post inferior myocardial infarction in July 2010. At that time she had an angioplasty and bare-metal stent placement to the right coronary artery.  Most recent stress test was in August of 2014  showed no evidence of ischemia with normal ejection fraction.  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 She is on longterm Fentanyl patch. She is on insulin pump for diabetes. Echocardiogram in December 2018 showed normal LV systolic function but there was evidence of diastolic dysfunction with elevated filling pressures.  Lower extremity arterial Doppler in 12/2017 showed normal ABI bilaterally. She continues to take small dose Xarelto which was prescribed by her primary care physician.  She had recurrent angina and shortness of breath in 2022.  A right and left cardiac catheterization was done in March which showed significant one-vessel coronary artery disease due to in-stent restenosis in the proximal right coronary artery, moderate distal RCA disease and right PDA disease and mild left main and LAD disease.  Ejection fraction was normal.  Right heart catheterization showed moderately elevated filling pressures in the setting of severe hypertension, mild to moderate pulmonary hypertension and normal cardiac output.  I performed successful drug-eluting stent placement to the right coronary artery for in-stent restenosis. I added amlodipine for blood pressure control.    She was most recently seen in June and reported exertional chest pain at that time after she ran out of carvedilol for 2  weeks.  Her blood pressure was uncontrolled.  Carvedilol was resumed.  An echocardiogram was performed which showed normal LV systolic function with grade 2 diastolic dysfunction and no significant valvular abnormalities.  She reports improvement in chest pain and shortness of breath since she resumed taking Coreg.  However, shortly after that she started having severe headache that lasted for 10 days.  Ultimately, she was hospitalized at [facility].  She had CT scans and MRIs of the brain which were unremarkable.  She was diagnosed with 6th nerve palsy and she has been wearing an eye patch on the left side with improvement in symptoms.   Past Medical History:  Diagnosis Date   CAD (coronary artery disease)    s/p MI & stenting   DM2 (diabetes mellitus, type 2) (HCC)    HLD (hyperlipidemia)    HTN (hypertension)    MI (myocardial infarction) (HCC)    Sympathetic reflex dystrophy    Tobacco abuse     Past Surgical History:  Procedure Laterality Date   CARDIAC CATHETERIZATION  06/05/2009   Inferior MI. Severe 1 vessel CAD (RCA)   CARPAL TUNNEL RELEASE  2002   left   CHOLECYSTECTOMY  2006   CORONARY ANGIOPLASTY WITH STENT PLACEMENT  06/05/2009   BMS (4.0 X 23 mm) Vision stent to RCA   CORONARY STENT PLACEMENT  2010   CORONARY STENT PLACEMENT  02/27/2021   DILATION AND CURETTAGE OF UTERUS  1992 & 2004   INTRAVASCULAR IMAGING/OCT N/A 02/27/2021   Procedure: INTRAVASCULAR IMAGING/OCT;  Surgeon: Iran Ouch, MD;  Location: [facility];  Service: Cardiovascular;  Laterality: N/A;   RIGHT/LEFT HEART CATH AND CORONARY ANGIOGRAPHY N/A 02/27/2021   Procedure: RIGHT/LEFT HEART CATH AND CORONARY ANGIOGRAPHY;  Surgeon: Iran Ouch, MD;  Location: [facility];  Service: Cardiovascular;  Laterality: N/A;   TONSILLECTOMY  1971   TUBAL LIGATION  2005     Current Outpatient Medications  Medication Sig Dispense Refill   amLODipine (NORVASC) 10 MG tablet Take 10 mg by mouth  daily.     aspirin 81 MG tablet Take 81 mg by mouth daily.     atorvastatin (LIPITOR) 80 MG tablet Take 1 tablet (80 mg total) by mouth every evening. 30 tablet 6   carvedilol (COREG) 12.5 MG tablet Take 1 tablet (12.5 mg total) by mouth 2 (two) times daily with a meal. 180 tablet 3   clopidogrel (PLAVIX) 75 MG tablet Take 1 tablet (75 mg total) by mouth daily with breakfast. 90 tablet 3   Continuous Blood Gluc Receiver (DEXCOM G6 RECEIVER) DEVI See admin instructions.     Continuous Blood Gluc Sensor (DEXCOM G6 SENSOR) MISC See admin instructions.     Continuous Blood Gluc Transmit (DEXCOM G6 TRANSMITTER) MISC See admin instructions.     DULoxetine (CYMBALTA) 60 MG capsule Take 60 mg by mouth daily.     ezetimibe (ZETIA) 10 MG tablet Take 10 mg by mouth daily.     FARXIGA 10 MG TABS tablet Take 10 mg by mouth daily.     fentaNYL (DURAGESIC) 25 MCG/HR Place 1 patch onto the skin every other day.     furosemide (LASIX) 20 MG tablet Take 20 mg by mouth 2 (two) times daily.     glycopyrrolate (ROBINUL) 1 MG tablet Take 1 mg by mouth daily as needed (sweating).     Insulin Disposable Pump (OMNIPOD 5 PACK) MISC      loratadine (CLARITIN) 10 MG tablet Take 10 mg by mouth daily.     olmesartan (BENICAR) 40 MG tablet Take 40 mg by mouth daily.     Oxycodone HCl 10 MG TABS Take 10 mg by mouth 3 (three) times daily.     pantoprazole (PROTONIX) 40 MG tablet Take 40 mg by mouth daily.     UBRELVY 100 MG TABS Take 100 mg by mouth daily.     vitamin B-12 (CYANOCOBALAMIN) 50 MCG tablet Take 50 mcg by mouth daily.     Vitamin D, Ergocalciferol, (DRISDOL) 1.25 MG (50000 UNIT) CAPS capsule Take 50,000 Units by mouth 2 (two) times a week.     No current facility-administered medications for this visit.    Allergies:   Patient has no known allergies.    Social History:  The patient  reports that she has been smoking cigarettes. She has a 20.00 pack-year smoking history. She has never used smokeless tobacco.  She reports that she does not drink alcohol and does not use drugs.   Family History:  The patient's family history includes Arrhythmia in her father; Huntington's disease in her mother and another family member; Hyperlipidemia in her father; Hypertension in her father.    ROS:  Please see the history of present illness.   Otherwise, review of systems are positive for none.   All other systems are reviewed and negative.    PHYSICAL EXAM: VS:  BP (!) 126/56 (BP Location: Left Arm, Patient Position: Sitting, Cuff Size: Normal)   Pulse (!) 59   Ht 4\' 10"  (1.473 m)   Wt 183 lb (83 kg)   BMI  38.25 kg/m  , BMI Body mass index is 38.25 kg/m. GEN: Well nourished, well developed, in no acute distress  HEENT: normal  Neck: no JVD, carotid bruits, or masses Cardiac: RRR; no  rubs, or gallops,no edema . There is 2/6 systolic ejection murmur in the aortic area and Respiratory:  clear to auscultation bilaterally, normal work of breathing GI: soft, nontender, nondistended, + BS MS: no deformity or atrophy  Skin: warm and dry, no rash Neuro:  Strength and sensation are intact Psych: euthymic mood, full affect   EKG:  EKG is ordered today. The ekg ordered today demonstrates sinus bradycardia with no significant ST or T wave changes.   Recent Labs: No results found for requested labs within last 365 days.    Lipid Panel No results found for: "CHOL", "TRIG", "HDL", "CHOLHDL", "VLDL", "LDLCALC", "LDLDIRECT"    Wt Readings from Last 3 Encounters:  08/03/22 183 lb (83 kg)  05/18/22 182 lb 9.6 oz (82.8 kg)  10/27/21 186 lb 6.4 oz (84.6 kg)      Other studies Reviewed:   ASSESSMENT AND PLAN:  1.  Coronary artery disease involving native coronary arteries with other forms of angina: The patient reports improvement in symptoms since she resumed carvedilol.  No need for repeat left heart cath at this point. Continue dual antiplatelet therapy.    2.   Chronic diastolic heart failure: She  appears to be euvolemic on current dose of furosemide 20 mg twice daily.   3. Essential hypertension: Her blood pressure is now well controlled after she resume carvedilol.  In addition, her amlodipine was increased to 10 mg daily.  3. Hyperlipidemia: Continue high dose atorvastatin.  Recommend a target LDL of less than 70.  Ezetimibe 10 mg daily was recently added.  4. Tobacco use: I discussed with her the importance of smoking cessation.  Unfortunately, she is smoking 1 pack/day again.  She reports inability to quit.    Disposition:   FU with me in 6 months.  Signed,  Lorine Bears, MD  08/03/2022 10:16 AM    [facility]

## 2022-08-03 NOTE — Patient Instructions (Signed)
Medication Instructions:  The current medical regimen is effective;  continue present plan and medications.  *If you need a refill on your cardiac medications before your next appointment, please call your pharmacy*   Follow-Up: At [facility], you and your health needs are our priority.  As part of our continuing mission to provide you with exceptional heart care, we have created designated Provider Care Teams.  These Care Teams include your primary Cardiologist (physician) and Advanced Practice Providers (APPs -  Physician Assistants and Nurse Practitioners) who all work together to provide you with the care you need, when you need it.  We recommend signing up for the patient portal called "MyChart".  Sign up information is provided on this After Visit Summary.  MyChart is used to connect with patients for Virtual Visits (Telemedicine).  Patients are able to view lab/test results, encounter notes, upcoming appointments, etc.  Non-urgent messages can be sent to your provider as well.   To learn more about what you can do with MyChart, go to [email or web address].    Your next appointment:   6 month(s)  The format for your next appointment:   In Person  Provider:   Lorine Bears, MD

## 2022-08-10 NOTE — Progress Notes (Unsigned)
[facility]  PATIENT: [name] DOB: 03/24/1966  REFERRING DOCTOR OR PCP: Evelena Leyden. OD; York Ram, MD (PCP) SOURCE: ***  _________________________________   HISTORICAL  CHIEF COMPLAINT:  No chief complaint on file.   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    Vascular risk factors: She has coronary artery disease, hyperlipidemia, insulin-dependent diabetes mellitus, essential hypertension and chronic diastolic heart failure.  Imaging: MRI of the head 06/05/2022 showed a small number of small T2/FLAIR hyperintense foci in the subcortical and deep white matter consistent with mild age-related chronic microvascular ischemic change.  Additionally there is a small lacunar infarction in the left posterolateral thalamus.  There were no acute findings.  Normal enhancement pattern.  Laboratory:  REVIEW OF SYSTEMS: Constitutional: No fevers, chills, sweats, or change in appetite Eyes: No visual changes, double vision, eye pain Ear, nose and throat: No hearing loss, ear pain, nasal congestion, sore throat Cardiovascular: No chest pain, palpitations Respiratory:  No shortness of breath at rest or with exertion.   No wheezes GastrointestinaI: No nausea, vomiting, diarrhea, abdominal pain, fecal incontinence Genitourinary:  No dysuria, urinary retention or frequency.  No nocturia. Musculoskeletal:  No neck pain, back pain Integumentary: No rash, pruritus, skin lesions Neurological: as above Psychiatric: No depression at this time.  No anxiety Endocrine: No palpitations, diaphoresis, change in appetite, change in weigh or increased thirst Hematologic/Lymphatic:  No anemia, purpura, petechiae. Allergic/Immunologic: No itchy/runny eyes, nasal congestion, recent allergic reactions, rashes  ALLERGIES: No Known  Allergies  HOME MEDICATIONS:  Current Outpatient Medications:    amLODipine (NORVASC) 10 MG tablet, Take 10 mg by mouth daily., Disp: , Rfl:    aspirin 81 MG tablet, Take 81 mg by mouth daily., Disp: , Rfl:    atorvastatin (LIPITOR) 80 MG tablet, Take 1 tablet (80 mg total) by mouth every evening., Disp: 30 tablet, Rfl: 6   carvedilol (COREG) 12.5 MG tablet, Take 1 tablet (12.5 mg total) by mouth 2 (two) times daily with a meal., Disp: 180 tablet, Rfl: 3   clopidogrel (PLAVIX) 75 MG tablet, Take 1 tablet (75 mg total) by mouth daily with breakfast., Disp: 90 tablet, Rfl: 3   Continuous Blood Gluc Receiver (DEXCOM G6 RECEIVER) DEVI, See admin instructions., Disp: , Rfl:    Continuous Blood Gluc Sensor (DEXCOM G6 SENSOR) MISC, See admin instructions., Disp: , Rfl:    Continuous Blood Gluc Transmit (DEXCOM G6 TRANSMITTER) MISC, See admin instructions., Disp: , Rfl:    DULoxetine (CYMBALTA) 60 MG capsule, Take 60 mg by mouth daily., Disp: , Rfl:    ezetimibe (ZETIA) 10 MG tablet, Take 10 mg by mouth daily., Disp: , Rfl:    FARXIGA 10 MG TABS tablet, Take 10 mg by mouth daily., Disp: , Rfl:    fentaNYL (DURAGESIC) 25 MCG/HR, Place 1 patch onto the skin every other day., Disp: , Rfl:    furosemide (LASIX) 20 MG tablet, Take 20 mg by mouth 2 (two) times daily., Disp: , Rfl:    glycopyrrolate (ROBINUL) 1 MG tablet, Take 1 mg by mouth daily as needed (sweating)., Disp: , Rfl:    Insulin Disposable Pump (OMNIPOD 5 PACK) MISC, , Disp: , Rfl:    loratadine (CLARITIN) 10 MG tablet, Take 10 mg by mouth daily., Disp: , Rfl:    olmesartan (BENICAR) 40 MG tablet, Take 40 mg by mouth daily., Disp: , Rfl:  Oxycodone HCl 10 MG TABS, Take 10 mg by mouth 3 (three) times daily., Disp: , Rfl:    pantoprazole (PROTONIX) 40 MG tablet, Take 40 mg by mouth daily., Disp: , Rfl:    UBRELVY 100 MG TABS, Take 100 mg by mouth daily., Disp: , Rfl:    vitamin B-12 (CYANOCOBALAMIN) 50 MCG tablet, Take 50 mcg by mouth daily.,  Disp: , Rfl:    Vitamin D, Ergocalciferol, (DRISDOL) 1.25 MG (50000 UNIT) CAPS capsule, Take 50,000 Units by mouth 2 (two) times a week., Disp: , Rfl:   PAST MEDICAL HISTORY: Past Medical History:  Diagnosis Date   CAD (coronary artery disease)    s/p MI & stenting   DM2 (diabetes mellitus, type 2) (HCC)    HLD (hyperlipidemia)    HTN (hypertension)    MI (myocardial infarction) (HCC)    Sympathetic reflex dystrophy    Tobacco abuse     PAST SURGICAL HISTORY: Past Surgical History:  Procedure Laterality Date   CARDIAC CATHETERIZATION  06/05/2009   Inferior MI. Severe 1 vessel CAD (RCA)   CARPAL TUNNEL RELEASE  2002   left   CHOLECYSTECTOMY  2006   CORONARY ANGIOPLASTY WITH STENT PLACEMENT  06/05/2009   BMS (4.0 X 23 mm) Vision stent to RCA   CORONARY STENT PLACEMENT  2010   CORONARY STENT PLACEMENT  02/27/2021   DILATION AND CURETTAGE OF UTERUS  1992 & 2004   INTRAVASCULAR IMAGING/OCT N/A 02/27/2021   Procedure: INTRAVASCULAR IMAGING/OCT;  Surgeon: Iran Ouch, MD;  [facility]: [facility];  Service: Cardiovascular;  Laterality: N/A;   RIGHT/LEFT HEART CATH AND CORONARY ANGIOGRAPHY N/A 02/27/2021   Procedure: RIGHT/LEFT HEART CATH AND CORONARY ANGIOGRAPHY;  Surgeon: Iran Ouch, MD;  Location: [facility];  Service: Cardiovascular;  Laterality: N/A;   TONSILLECTOMY  1971   TUBAL LIGATION  2005    FAMILY HISTORY: Family History  Problem Relation Age of Onset   Huntington's disease Mother    Hypertension Father    Hyperlipidemia Father    Arrhythmia Father    Huntington's disease Other     SOCIAL HISTORY:  Social History   Socioeconomic History   Marital status: Married    Spouse name: Not on file   Number of children: 2   Years of education: Not on file   Highest education level: Not on file  Occupational History   Occupation: disabled  Tobacco Use   Smoking status: Every Day    Packs/day: 0.80    Years: 25.00    Total pack years:  20.00    Types: Cigarettes   Smokeless tobacco: Never  Vaping Use   Vaping Use: Never used  Substance and Sexual Activity   Alcohol use: No   Drug use: No   Sexual activity: Not on file  Other Topics Concern   Not on file  Social History Narrative   Not on file   Social Determinants of Health   Financial Resource Strain: Not on file  Food Insecurity: Not on file  Transportation Needs: Not on file  Physical Activity: Not on file  Stress: Not on file  Social Connections: Not on file  Intimate Partner Violence: Not on file     PHYSICAL EXAM  There were no vitals filed for this visit.  There is no height or weight on file to calculate BMI.   General: The patient is well-developed and well-nourished and in no acute distress  HEENT:  Head is [place]/AT.  Sclera  are anicteric.  Funduscopic exam shows normal optic discs and retinal vessels.  Neck: No carotid bruits are noted.  The neck is nontender.  Cardiovascular: The heart has a regular rate and rhythm with a normal S1 and S2. There were no murmurs, gallops or rubs.    Skin: Extremities are without rash or  edema.  Musculoskeletal:  Back is nontender  Neurologic Exam  Mental status: The patient is alert and oriented x 3 at the time of the examination. The patient has apparent normal recent and remote memory, with an apparently normal attention span and concentration ability.   Speech is normal.  Cranial nerves: Extraocular movements are full. Pupils are equal, round, and reactive to light and accomodation.  Visual fields are full.  Facial symmetry is present. There is good facial sensation to soft touch bilaterally.Facial strength is normal.  Trapezius and sternocleidomastoid strength is normal. No dysarthria is noted.  The tongue is midline, and the patient has symmetric elevation of the soft palate. No obvious hearing deficits are noted.  Motor:  Muscle bulk is normal.   Tone is normal. Strength is  5 / 5 in all 4  extremities.   Sensory: Sensory testing is intact to pinprick, soft touch and vibration sensation in all 4 extremities.  Coordination: Cerebellar testing reveals good finger-nose-finger and heel-to-shin bilaterally.  Gait and station: Station is normal.   Gait is normal. Tandem gait is normal. Romberg is negative.   Reflexes: Deep tendon reflexes are symmetric and normal bilaterally.   Plantar responses are flexor.    DIAGNOSTIC DATA (LABS, IMAGING, TESTING) - I reviewed patient records, labs, notes, testing and imaging myself where available.  Lab Results  Component Value Date   WBC 16.2 (H) 02/28/2021   HGB 14.0 02/28/2021   HCT 44.6 02/28/2021   MCV 82.3 02/28/2021   PLT 388 02/28/2021      Component Value Date/Time   NA 137 02/28/2021 0128   NA 136 02/10/2021 1100   K 3.5 02/28/2021 0128   CL 102 02/28/2021 0128   CO2 29 02/28/2021 0128   GLUCOSE 84 02/28/2021 0128   BUN 12 02/28/2021 0128   BUN 8 02/10/2021 1100   CREATININE 0.92 02/28/2021 0128   CALCIUM 8.9 02/28/2021 0128   GFRNONAA >60 02/28/2021 0128   No results found for: "CHOL", "HDL", "LDLCALC", "LDLDIRECT", "TRIG", "CHOLHDL" No results found for: "HGBA1C" No results found for: "VITAMINB12" No results found for: "TSH"     ASSESSMENT AND PLAN  ***   Whitleigh Garramone A. Epimenio Foot, MD, [facility] 08/10/2022, 9:03 PM Certified in Neurology, Clinical Neurophysiology, Sleep Medicine and Neuroimaging  [facility] [address] [place], [place] [address] [phone]

## 2022-08-11 ENCOUNTER — Ambulatory Visit (INDEPENDENT_AMBULATORY_CARE_PROVIDER_SITE_OTHER): Payer: BC Managed Care – PPO | Admitting: Neurology

## 2022-08-11 ENCOUNTER — Encounter: Payer: Self-pay | Admitting: Neurology

## 2022-08-11 VITALS — BP 125/45 | HR 55 | Ht <= 58 in | Wt 186.0 lb

## 2022-08-11 DIAGNOSIS — H4922 Sixth [abducent] nerve palsy, left eye: Secondary | ICD-10-CM | POA: Diagnosis not present

## 2022-08-11 DIAGNOSIS — F17200 Nicotine dependence, unspecified, uncomplicated: Secondary | ICD-10-CM

## 2022-08-11 DIAGNOSIS — H532 Diplopia: Secondary | ICD-10-CM

## 2022-08-11 DIAGNOSIS — I25118 Atherosclerotic heart disease of native coronary artery with other forms of angina pectoris: Secondary | ICD-10-CM

## 2022-08-11 DIAGNOSIS — E119 Type 2 diabetes mellitus without complications: Secondary | ICD-10-CM | POA: Diagnosis not present

## 2022-08-11 DIAGNOSIS — I10 Essential (primary) hypertension: Secondary | ICD-10-CM

## 2022-08-11 DIAGNOSIS — E1142 Type 2 diabetes mellitus with diabetic polyneuropathy: Secondary | ICD-10-CM

## 2022-08-11 DIAGNOSIS — Z794 Long term (current) use of insulin: Secondary | ICD-10-CM

## 2022-08-11 DIAGNOSIS — G4459 Other complicated headache syndrome: Secondary | ICD-10-CM | POA: Diagnosis not present

## 2022-08-12 ENCOUNTER — Telehealth: Payer: Self-pay | Admitting: *Deleted

## 2022-08-12 LAB — C-REACTIVE PROTEIN: CRP: 5 mg/L (ref 0–10)

## 2022-08-12 LAB — SEDIMENTATION RATE: Sed Rate: 17 mm/hr (ref 0–40)

## 2022-08-12 NOTE — Telephone Encounter (Signed)
Called and spoke w/ pt about results per Dr. Sater's note. Pt verbalized understanding.  

## 2022-08-12 NOTE — Telephone Encounter (Signed)
-----   Message from Asa Lente, MD sent at 08/12/2022  8:46 AM EDT ----- Please let her know that the lab work we tested yesterday was fine

## 2023-03-24 ENCOUNTER — Telehealth: Payer: Self-pay | Admitting: Neurology

## 2023-03-24 NOTE — Telephone Encounter (Signed)
Called pt back. LVM offering work in appt with Dr. Epimenio Foot today at 1:30pm, check in 1:00pm. Asked her to call back asap to let us know if this works for her.

## 2023-03-24 NOTE — Telephone Encounter (Signed)
Pt stated she is having a bad headache in her temple and in her left eye. Pt said Dr. Epimenio Foot told her to call him if this kind of headache came back. Pt is requesting a sonner appointment to see Dr. Epimenio Foot.

## 2023-03-28 NOTE — Telephone Encounter (Signed)
Called pt again since no return call. She states she got call but ended up getting in with PCP same day she called [place] and could not take our appt offered. PCP provided her samples of Qulipta to try. She was told not to take with Bernita Raisin by PCP but she states she took together anyway d/t severity of headache she was having . PCP recommended she f/u with eye doctor as well. She wanted to know what Dr. Epimenio Foot would recommend. I scheduled work in appt for 03/30/23 at 9:30am, check in 9:00am. Asked her to bring updated insurance cards and med list with her. She verbalized understanding.

## 2023-03-30 ENCOUNTER — Encounter: Payer: Self-pay | Admitting: Neurology

## 2023-03-30 ENCOUNTER — Ambulatory Visit (INDEPENDENT_AMBULATORY_CARE_PROVIDER_SITE_OTHER): Payer: BC Managed Care – PPO | Admitting: Neurology

## 2023-03-30 VITALS — BP 125/68 | HR 60 | Ht <= 58 in | Wt 181.5 lb

## 2023-03-30 DIAGNOSIS — M542 Cervicalgia: Secondary | ICD-10-CM

## 2023-03-30 DIAGNOSIS — E1142 Type 2 diabetes mellitus with diabetic polyneuropathy: Secondary | ICD-10-CM

## 2023-03-30 DIAGNOSIS — G43709 Chronic migraine without aura, not intractable, without status migrainosus: Secondary | ICD-10-CM | POA: Diagnosis not present

## 2023-03-30 DIAGNOSIS — H4922 Sixth [abducent] nerve palsy, left eye: Secondary | ICD-10-CM | POA: Diagnosis not present

## 2023-03-30 DIAGNOSIS — Z794 Long term (current) use of insulin: Secondary | ICD-10-CM

## 2023-03-30 DIAGNOSIS — E119 Type 2 diabetes mellitus without complications: Secondary | ICD-10-CM

## 2023-03-30 DIAGNOSIS — G4489 Other headache syndrome: Secondary | ICD-10-CM | POA: Diagnosis not present

## 2023-03-30 DIAGNOSIS — I25118 Atherosclerotic heart disease of native coronary artery with other forms of angina pectoris: Secondary | ICD-10-CM

## 2023-03-30 MED ORDER — AJOVY 225 MG/1.5ML ~~LOC~~ SOAJ: SUBCUTANEOUS | 11 refills | Status: AC

## 2023-03-30 NOTE — Progress Notes (Signed)
[facility]  PATIENT: [name] DOB: 1966/09/15  REFERRING DOCTOR OR PCP: Evelena Leyden. OD; Joaquin Music, MD (PCP) SOURCE: Patient, notes from optometry, imaging and lab reports, MRI images personally reviewed.  _________________________________   HISTORICAL  CHIEF COMPLAINT:  Chief Complaint  Patient presents with   Follow-up    Pt in room 10, husband in room. Here for Sixth nerve palsy follow up. Pt c/o headaches, pt said sixth nerve has cleared up. Pt reports headaches are daily, left side behind left eye, pt said for about 1 week she noticed balance is worsen, no falls.    HISTORY OF PRESENT ILLNESS:  [name] is a 57 y.o. woman with headache and h/o 6th nerve palsy.     Update 03/30/2023: She has a left temple/orbital headache with constant icepick quality (with fluctuates).   Headache became near constant 7-10 days ago.   She feels, associated with HA worsening, that balance and fatigue are worse.     She took [place] with Bernita Raisin and headache improved however the Ubrelvy by itself did not help.        Initially, HAs were happening intermittently and Bernita Raisin was helping.    She is tender at left occiput.    Sixth Nerve palsy history She had a severe headache in early June 2023.   A couple days later, she started to have diplopia and also felt dizzy.   She went to the [facility] ED 05/08/2022 and had CTA which ruled out aneurysm.   This was followed by brain MRi 06/05/2022 which showed mild chronic microvascular ischemic change.     She was referrd to Dr. Abelina Bachelor    She reported that she tried to get into neuro-ophthalmology initially but could not get an appointment.  Therefore, she is being referred to neurology.    In general, headaches are doing better.    Over the last 2 weeks, she started to note some abduction of the left eye though diplopia persists.     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  She recovered fairly well but was hospitalized x 2  months.   She had increased left sided weakness at age 38 associated with a pregnancy.   At age 69 she had another pregnancy/delivery and left leg weakness worsened and she was in a wheelchair a while and then used a walker and then cane (since age 59).   She has seen doctors at [facility] and [facility].  She reports having EMG/NCV studies.  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  We do not have studies.  Vascular risk factors:  She has coronary artery disease, hyperlipidemia, insulin-dependent diabetes mellitus, essential hypertension and chronic diastolic heart failure.   She smokes 1 ppd (x 40 years).     She had a PSG in the past and she reports no OSA.    Imaging:  MRI of the head 06/05/2022 showed a small number of small T2/FLAIR hyperintense foci in the subcortical and deep white matter consistent with mild age-related chronic microvascular ischemic change.  Additionally there is a small lacunar infarction in the left posterolateral thalamus.  There were no acute findings.  Normal enhancement pattern.  CT angiogram 06/04/2022 did not show any large vessel stenosis.  Laboratory:  REVIEW OF SYSTEMS: Constitutional: No fevers, chills, sweats, or change in appetite Eyes: No visual changes, double vision, eye pain Ear, nose and throat: No hearing loss, ear pain, nasal congestion, sore  throat Cardiovascular: No chest pain, palpitations Respiratory:  No shortness of breath at rest or with exertion.   No wheezes GastrointestinaI: No nausea, vomiting, diarrhea, abdominal pain, fecal incontinence Genitourinary:  No dysuria, urinary retention or frequency.  No nocturia. Musculoskeletal:  Neck pain as above.   Some LBP Integumentary: No rash, pruritus, skin lesions Neurological: as above Psychiatric: No depression at this time.  No anxiety Endocrine: No palpitations, diaphoresis, change in appetite, change in weigh or increased thirst Hematologic/Lymphatic:  No  anemia, purpura, petechiae. Allergic/Immunologic: No itchy/runny eyes, nasal congestion, recent allergic reactions, rashes  ALLERGIES: No Known Allergies  HOME MEDICATIONS:  Current Outpatient Medications:    amLODipine (NORVASC) 10 MG tablet, Take 10 mg by mouth daily., Disp: , Rfl:    aspirin 81 MG tablet, Take 81 mg by mouth daily., Disp: , Rfl:    atorvastatin (LIPITOR) 80 MG tablet, Take 1 tablet (80 mg total) by mouth every evening., Disp: 30 tablet, Rfl: 6   carvedilol (COREG) 12.5 MG tablet, Take 1 tablet (12.5 mg total) by mouth 2 (two) times daily with a meal., Disp: 180 tablet, Rfl: 3   clopidogrel (PLAVIX) 75 MG tablet, Take 1 tablet (75 mg total) by mouth daily with breakfast., Disp: 90 tablet, Rfl: 3   Continuous Blood Gluc Receiver (DEXCOM G6 RECEIVER) DEVI, See admin instructions., Disp: , Rfl:    Continuous Blood Gluc Sensor (DEXCOM G6 SENSOR) MISC, See admin instructions., Disp: , Rfl:    Continuous Blood Gluc Transmit (DEXCOM G6 TRANSMITTER) MISC, See admin instructions., Disp: , Rfl:    DULoxetine (CYMBALTA) 60 MG capsule, Take 60 mg by mouth daily., Disp: , Rfl:    ezetimibe (ZETIA) 10 MG tablet, Take 10 mg by mouth daily., Disp: , Rfl:    FARXIGA 10 MG TABS tablet, Take 10 mg by mouth daily., Disp: , Rfl:    fentaNYL (DURAGESIC) 25 MCG/HR, Place 1 patch onto the skin every other day., Disp: , Rfl:    Fremanezumab-vfrm (AJOVY) 225 MG/1.5ML SOAJ, One pen subcutanously every 4 weeks, Disp: 1.68 mL, Rfl: 11   furosemide (LASIX) 20 MG tablet, Take 20 mg by mouth 2 (two) times daily., Disp: , Rfl:    glycopyrrolate (ROBINUL) 1 MG tablet, Take 1 mg by mouth daily as needed (sweating)., Disp: , Rfl:    Insulin Disposable Pump (OMNIPOD 5 PACK) MISC, , Disp: , Rfl:    loratadine (CLARITIN) 10 MG tablet, Take 10 mg by mouth daily., Disp: , Rfl:    olmesartan (BENICAR) 40 MG tablet, Take 40 mg by mouth daily., Disp: , Rfl:    Oxycodone HCl 10 MG TABS, Take 10 mg by mouth 3  (three) times daily., Disp: , Rfl:    pantoprazole (PROTONIX) 40 MG tablet, Take 40 mg by mouth daily., Disp: , Rfl:    UBRELVY 100 MG TABS, Take 100 mg by mouth daily., Disp: , Rfl:    vitamin B-12 (CYANOCOBALAMIN) 50 MCG tablet, Take 50 mcg by mouth daily., Disp: , Rfl:    Vitamin D, Ergocalciferol, (DRISDOL) 1.25 MG (50000 UNIT) CAPS capsule, Take 50,000 Units by mouth 2 (two) times a week., Disp: , Rfl:   PAST MEDICAL HISTORY: Past Medical History:  Diagnosis Date   CAD (coronary artery disease)    s/p MI & stenting   DM2 (diabetes mellitus, type 2)    Guillain Barr syndrome    HLD (hyperlipidemia)    HTN (hypertension)    MI (myocardial infarction)    Sympathetic reflex dystrophy  Tobacco abuse     PAST SURGICAL HISTORY: Past Surgical History:  Procedure Laterality Date   CARDIAC CATHETERIZATION  06/05/2009   Inferior MI. Severe 1 vessel CAD (RCA)   CARPAL TUNNEL RELEASE  2002   left   CHOLECYSTECTOMY  2006   CORONARY ANGIOPLASTY WITH STENT PLACEMENT  06/05/2009   BMS (4.0 X 23 mm) Vision stent to RCA   CORONARY IMAGING/OCT N/A 02/27/2021   Procedure: INTRAVASCULAR IMAGING/OCT;  Surgeon: Iran Ouch, MD;  [facility];  Service: Cardiovascular;  Laterality: N/A;   CORONARY STENT PLACEMENT  2010   CORONARY STENT PLACEMENT  02/27/2021   DILATION AND CURETTAGE OF UTERUS  1992 & 2004   RIGHT/LEFT HEART CATH AND CORONARY ANGIOGRAPHY N/A 02/27/2021   Procedure: RIGHT/LEFT HEART CATH AND CORONARY ANGIOGRAPHY;  Surgeon: Iran Ouch, MD;  Location: [facility];  Service: Cardiovascular;  Laterality: N/A;   TONSILLECTOMY  1971   TUBAL LIGATION  2005    FAMILY HISTORY: Family History  Problem Relation Age of Onset   Huntington's disease Mother    Hypertension Father    Hyperlipidemia Father    Arrhythmia Father    Huntington's disease Other     SOCIAL HISTORY:  Social History   Socioeconomic History   Marital status: Married     Spouse name: Not on file   Number of children: 2   Years of education: Associates   Highest education level: Not on file  Occupational History   Occupation: disabled  Tobacco Use   Smoking status: Every Day    Packs/day: 1.00    Years: 25.00    Additional pack years: 0.00    Total pack years: 25.00    Types: Cigarettes   Smokeless tobacco: Never  Vaping Use   Vaping Use: Never used  Substance and Sexual Activity   Alcohol use: No   Drug use: No   Sexual activity: Not on file  Other Topics Concern   Not on file  Social History Narrative   Right handed   Drinks Diet Mt Dew, no coffee or tea   Lives with husband   Social Determinants of Health   Financial Resource Strain: Not on file  Food Insecurity: Not on file  Transportation Needs: Not on file  Physical Activity: Not on file  Stress: Not on file  Social Connections: Not on file  Intimate Partner Violence: Not on file     PHYSICAL EXAM  Vitals:   03/30/23 0924  BP: 125/68  Pulse: 60  Weight: 181 lb 8 oz (82.3 kg)  Height:  (1.473 m)    Body mass index is 37.93 kg/m.   General: The patient is well-developed and well-nourished and in no acute distress  HEENT:  Head is [place]/AT.  Sclera are anicteric.  Funduscopic exam shows normal optic discs and retinal vessels.  Neck: No carotid bruits are noted.  The neck is tender over left occiput.  Inc pain with movements..  Skin: Extremities are without rash or  edema.  A little bruising on arms    Neurologic Exam  Mental status: The patient is alert and oriented x 3 at the time of the examination. The patient has apparent normal recent and remote memory, with an apparently normal attention span and concentration ability.   Speech is normal.  Cranial nerves: Extraocular movements are now normal.   FSacial strength and sensation was normal.  . No obvious hearing deficits are noted.  Motor:  Muscle  bulk is normal.   Tone is normal. Strength is  5 / 5 in all 4  extremities.   Sensory: Sensory testing is intact to pinprick, soft touch and vibration sensation in the arms but mildly reduced vibration sensation at the knees and moderately reduced vibration sensation at the ankles and toes.  There is reduced pinprick sensation at the toes.  Coordination: Cerebellar testing reveals good finger-nose-finger and heel-to-shin bilaterally.  Gait and station: Station is normal.   Gait is normal. Tandem gait is reduced. Romberg is negative.   Reflexes: Deep tendon reflexes are symmetric and normal bilaterally.        DIAGNOSTIC DATA (LABS, IMAGING, TESTING) - I reviewed patient records, labs, notes, testing and imaging myself where available.  Lab Results  Component Value Date   WBC 16.2 (H) 02/28/2021   HGB 14.0 02/28/2021   HCT 44.6 02/28/2021   MCV 82.3 02/28/2021   PLT 388 02/28/2021      Component Value Date/Time   NA 137 02/28/2021 0128   NA 136 02/10/2021 1100   K 3.5 02/28/2021 0128   CL 102 02/28/2021 0128   CO2 29 02/28/2021 0128   GLUCOSE 84 02/28/2021 0128   BUN 12 02/28/2021 0128   BUN 8 02/10/2021 1100   CREATININE 0.92 02/28/2021 0128   CALCIUM 8.9 02/28/2021 0128   GFRNONAA >60 02/28/2021 0128        ASSESSMENT AND PLAN  Chronic migraine w/o aura, not intractable, w/o stat migr  Sixth nerve palsy of left eye  Insulin dependent type 2 diabetes mellitus  Diabetic polyneuropathy associated with type 2 diabetes mellitus  Coronary artery disease involving native coronary artery of native heart with other form of angina pectoris  Other headache syndrome  Neck pain   VIth nerve palsy has resolved. Trial of Ajovy Lot# [id number]  04/2025.    Ok to take [place] for breakthrough   - try to limit to <10 moth Inject left splenius capitus muscle with 60 mg DepoMedrol in 3 cc Marcaine using sterile technique.   She tolerated well and pain improved.    Rtc 12 month or ssoner for new or worsening symptoms.    Laurenashley Viar A.  Epimenio Foot, MD, [facility] 03/30/2023, 10:09 AM Certified in Neurology, Clinical Neurophysiology, Sleep Medicine and Neuroimaging  [facility] [address], [address] [place], [place] [address] [phone]

## 2023-04-19 ENCOUNTER — Telehealth: Payer: Self-pay | Admitting: Neurology

## 2023-04-19 NOTE — Telephone Encounter (Signed)
Pt stated she needs PA for Fremanezumab-vfrm (AJOVY) 225 MG/1.5ML SOAJ

## 2023-04-21 NOTE — Telephone Encounter (Addendum)
Submitted PA for Ajovy 04/21/2023 [facility] Decision Pending

## 2023-04-26 NOTE — Telephone Encounter (Signed)
Called [employer] at [phone], option 3 (med auth), option 3 (all other med requests). Spoke w/ Inetta Fermo. Pt ID: [id number]. Stating pt pharmacy vendor: [email or web address]. They manage pharmacy benefits. Phone# [phone]. She transferred me and I spoke w/ January.   Submitted PA over the phone. PA approved 04/26/23-10/27/23. PA approval: [id number].   Called pt and let her know that PA approved. She verbalized understanding and appreciation.

## 2023-05-07 ENCOUNTER — Other Ambulatory Visit: Payer: Self-pay | Admitting: Cardiovascular Disease

## 2023-05-09 NOTE — Telephone Encounter (Signed)
Refill Request.  

## 2023-05-30 ENCOUNTER — Other Ambulatory Visit: Payer: Self-pay | Admitting: Cardiovascular Disease

## 2023-05-30 NOTE — Telephone Encounter (Signed)
Refill request

## 2023-05-31 ENCOUNTER — Ambulatory Visit: Payer: BC Managed Care – PPO | Attending: Cardiovascular Disease | Admitting: Cardiovascular Disease

## 2023-05-31 ENCOUNTER — Encounter: Payer: Self-pay | Admitting: Cardiovascular Disease

## 2023-05-31 VITALS — BP 128/64 | Ht <= 58 in | Wt 180.6 lb

## 2023-05-31 DIAGNOSIS — E785 Hyperlipidemia, unspecified: Secondary | ICD-10-CM | POA: Diagnosis not present

## 2023-05-31 DIAGNOSIS — I5032 Chronic diastolic (congestive) heart failure: Secondary | ICD-10-CM | POA: Diagnosis not present

## 2023-05-31 DIAGNOSIS — I2510 Atherosclerotic heart disease of native coronary artery without angina pectoris: Secondary | ICD-10-CM | POA: Diagnosis not present

## 2023-05-31 DIAGNOSIS — I10 Essential (primary) hypertension: Secondary | ICD-10-CM

## 2023-05-31 DIAGNOSIS — Z720 Tobacco use: Secondary | ICD-10-CM

## 2023-05-31 NOTE — Progress Notes (Signed)
Cardiology Office Note   Date:  05/31/2023   ID:  [name], DOB 01-20-1966, MRN [id number]  PCP:  Runell Gess, MD  Cardiologist:   Lorine Bears, MD   No chief complaint on file.      History of Present Illness: [name] is a 57 y.o. female who presents for a followup visit regarding coronary artery disease. She is status post inferior myocardial infarction in July 2010. At that time she had an angioplasty and bare-metal stent placement to the right coronary artery.  Most recent stress test was in August of 2014  showed no evidence of ischemia with normal ejection fraction.  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 She is on longterm Fentanyl patch. She is on insulin pump for diabetes. Echocardiogram in December 2018 showed normal LV systolic function but there was evidence of diastolic dysfunction with elevated filling pressures.  Lower extremity arterial Doppler in 12/2017 showed normal ABI bilaterally.  She had recurrent angina and shortness of breath in 2022.  A right and left cardiac catheterization was done in March which showed significant one-vessel coronary artery disease due to in-stent restenosis in the proximal right coronary artery, moderate distal RCA disease and right PDA disease and mild left main and LAD disease.  Ejection fraction was normal.  Right heart catheterization showed moderately elevated filling pressures in the setting of severe hypertension, mild to moderate pulmonary hypertension and normal cardiac output.  I performed successful drug-eluting stent placement to the right coronary artery for in-stent restenosis. I added amlodipine for blood pressure control.    She had an echocardiogram in June 2023  which showed normal LV systolic function with grade 2 diastolic dysfunction and no significant valvular abnormalities.  She was hospitalized in 2023 with severe headache.  She was diagnosed with 6th nerve palsy  and she has been wearing an eye patch on the left side with improvement in symptoms.  She has been doing very well with no chest pain, shortness of breath or palpitations.   Past Medical History:  Diagnosis Date   CAD (coronary artery disease)    s/p MI & stenting   DM2 (diabetes mellitus, type 2) (HCC)    Guillain Barr syndrome (HCC)    HLD (hyperlipidemia)    HTN (hypertension)    MI (myocardial infarction) (HCC)    Sympathetic reflex dystrophy    Tobacco abuse     Past Surgical History:  Procedure Laterality Date   CARDIAC CATHETERIZATION  06/05/2009   Inferior MI. Severe 1 vessel CAD (RCA)   CARPAL TUNNEL RELEASE  2002   left   CHOLECYSTECTOMY  2006   CORONARY ANGIOPLASTY WITH STENT PLACEMENT  06/05/2009   BMS (4.0 X 23 mm) Vision stent to RCA   CORONARY IMAGING/OCT N/A 02/27/2021   Procedure: INTRAVASCULAR IMAGING/OCT;  Surgeon: Iran Ouch, MD;  [facility]: [facility];  Service: Cardiovascular;  Laterality: N/A;   CORONARY STENT PLACEMENT  2010   CORONARY STENT PLACEMENT  02/27/2021   DILATION AND CURETTAGE OF UTERUS  1992 & 2004   RIGHT/LEFT HEART CATH AND CORONARY ANGIOGRAPHY N/A 02/27/2021   Procedure: RIGHT/LEFT HEART CATH AND CORONARY ANGIOGRAPHY;  Surgeon: Iran Ouch, MD;  Location: [facility];  Service: Cardiovascular;  Laterality: N/A;   TONSILLECTOMY  1971   TUBAL LIGATION  2005     Current Outpatient Medications  Medication Sig Dispense Refill   amLODipine (NORVASC) 10 MG tablet Take 10  mg by mouth daily.     aspirin 81 MG tablet Take 81 mg by mouth daily.     atorvastatin (LIPITOR) 80 MG tablet Take 1 tablet (80 mg total) by mouth every evening. 30 tablet 6   carvedilol (COREG) 12.5 MG tablet Take 1 tablet (12.5 mg total) by mouth 2 (two) times daily with a meal. 180 tablet 3   clopidogrel (PLAVIX) 75 MG tablet Take 1 tablet (75 mg total) by mouth daily with breakfast. 90 tablet 3   Continuous Blood Gluc Receiver (DEXCOM G6  RECEIVER) DEVI See admin instructions.     Continuous Blood Gluc Sensor (DEXCOM G6 SENSOR) MISC See admin instructions.     Continuous Blood Gluc Transmit (DEXCOM G6 TRANSMITTER) MISC See admin instructions.     DULoxetine (CYMBALTA) 60 MG capsule Take 60 mg by mouth daily.     ezetimibe (ZETIA) 10 MG tablet Take 10 mg by mouth daily.     FARXIGA 10 MG TABS tablet Take 10 mg by mouth daily.     fentaNYL (DURAGESIC) 25 MCG/HR Place 1 patch onto the skin every other day.     Fremanezumab-vfrm (AJOVY) 225 MG/1.5ML SOAJ One pen subcutanously every 4 weeks 1.68 mL 11   furosemide (LASIX) 20 MG tablet Take 20 mg by mouth 2 (two) times daily.     glycopyrrolate (ROBINUL) 1 MG tablet Take 1 mg by mouth daily as needed (sweating).     Insulin Disposable Pump (OMNIPOD 5 PACK) MISC      loratadine (CLARITIN) 10 MG tablet Take 10 mg by mouth daily.     olmesartan (BENICAR) 40 MG tablet Take 40 mg by mouth daily.     Oxycodone HCl 10 MG TABS Take 10 mg by mouth 3 (three) times daily.     pantoprazole (PROTONIX) 40 MG tablet Take 40 mg by mouth daily.     UBRELVY 100 MG TABS Take 100 mg by mouth daily as needed.     vitamin B-12 (CYANOCOBALAMIN) 50 MCG tablet Take 50 mcg by mouth daily.     Vitamin D, Ergocalciferol, (DRISDOL) 1.25 MG (50000 UNIT) CAPS capsule Take 50,000 Units by mouth 2 (two) times a week.     No current facility-administered medications for this visit.    Allergies:   Patient has no known allergies.    Social History:  The patient  reports that she has been smoking cigarettes. She has a 25.00 pack-year smoking history. She has never used smokeless tobacco. She reports that she does not drink alcohol and does not use drugs.   Family History:  The patient's family history includes Arrhythmia in her father; Huntington's disease in her mother and another family member; Hyperlipidemia in her father; Hypertension in her father.    ROS:  Please see the history of present illness.    Otherwise, review of systems are positive for none.   All other systems are reviewed and negative.    PHYSICAL EXAM: VS:  BP 128/64   Ht 4\' 10"  (1.473 m)   Wt 180 lb 9.6 oz (81.9 kg)   BMI 37.75 kg/m  , BMI Body mass index is 37.75 kg/m. GEN: Well nourished, well developed, in no acute distress  HEENT: normal  Neck: no JVD, carotid bruits, or masses Cardiac: RRR; no  rubs, or gallops,no edema . There is 2/6 systolic ejection murmur in the aortic area and Respiratory:  clear to auscultation bilaterally, normal work of breathing GI: soft, nontender, nondistended, + BS MS: no deformity  or atrophy  Skin: warm and dry, no rash Neuro:  Strength and sensation are intact Psych: euthymic mood, full affect   EKG:  EKG is ordered today. The ekg ordered today demonstrates normal sinus rhythm with a PVC   Recent Labs: No results found for requested labs within last 365 days.    Lipid Panel No results found for: "CHOL", "TRIG", "HDL", "CHOLHDL", "VLDL", "LDLCALC", "LDLDIRECT"    Wt Readings from Last 3 Encounters:  05/31/23 180 lb 9.6 oz (81.9 kg)  03/30/23 181 lb 8 oz (82.3 kg)  08/11/22 186 lb (84.4 kg)      Other studies Reviewed:   ASSESSMENT AND PLAN:  1.  Coronary artery disease involving native coronary arteries without angina: She is doing very well overall with no anginal symptoms.  Dual antiplatelet therapy is optional at this time but she is tolerating and will continue.  2.   Chronic diastolic heart failure: She appears to be euvolemic on current dose of furosemide 20 mg twice daily.   3. Essential hypertension: Her blood pressure is now well controlled after she resume carvedilol.  In addition, her amlodipine was increased to 10 mg daily.  3. Hyperlipidemia: Continue high dose atorvastatin and ezetimibe with a target LDL of less than 70.  4. Tobacco use: I discussed with her the importance of smoking cessation.  Unfortunately, she is smoking 1 pack/day again.  She  reports inability to quit.    Disposition:   FU with me in 12 months.  Signed,  Lorine Bears, MD  05/31/2023 10:27 AM    [facility]

## 2023-05-31 NOTE — Patient Instructions (Signed)
Medication Instructions:  No changes *If you need a refill on your cardiac medications before your next appointment, please call your pharmacy*   Lab Work: None ordered If you have labs (blood work) drawn today and your tests are completely normal, you will receive your results only by: MyChart Message (if you have MyChart) OR A paper copy in the mail If you have any lab test that is abnormal or we need to change your treatment, we will call you to review the results.   Testing/Procedures: None ordered   Follow-Up: At  [facility], you and your health needs are our priority.  As part of our continuing mission to provide you with exceptional heart care, we have created designated Provider Care Teams.  These Care Teams include your primary Cardiologist (physician) and Advanced Practice Providers (APPs -  Physician Assistants and Nurse Practitioners) who all work together to provide you with the care you need, when you need it.  We recommend signing up for the patient portal called "MyChart".  Sign up information is provided on this After Visit Summary.  MyChart is used to connect with patients for Virtual Visits (Telemedicine).  Patients are able to view lab/test results, encounter notes, upcoming appointments, etc.  Non-urgent messages can be sent to your provider as well.   To learn more about what you can do with MyChart, go to [email or web address].    Your next appointment:   12 month(s)  Provider:   Muhammad Arida, MD       

## 2023-12-02 ENCOUNTER — Telehealth: Payer: Self-pay

## 2023-12-02 ENCOUNTER — Other Ambulatory Visit (HOSPITAL_COMMUNITY): Payer: Self-pay

## 2023-12-02 NOTE — Telephone Encounter (Signed)
   Received a renewal request from [facility]-PT has not been evaluated since she started Ajovy-Please advise

## 2023-12-05 NOTE — Telephone Encounter (Signed)
Pharmacy Patient Advocate Encounter   Received notification from Physician's Office that prior authorization for AJOVY (fremanezumab-vfrm) injection 225MG /1.5ML auto-injectors is required/requested.   Insurance verification completed.   The patient is insured through [facility] .   Per test claim: PA required; PA submitted to above mentioned insurance via CoverMyMeds Key/confirmation #/EOC [facility] Status is pending

## 2023-12-05 NOTE — Telephone Encounter (Signed)
I called pt and asked her the questions. Yes to all questions, that is correct.

## 2023-12-08 ENCOUNTER — Other Ambulatory Visit (HOSPITAL_COMMUNITY): Payer: Self-pay

## 2023-12-08 NOTE — Telephone Encounter (Signed)
 I called the pt and let her know Ajovy was approved. She was appreciative. She will continue using the copay card.

## 2023-12-08 NOTE — Telephone Encounter (Signed)
 Pharmacy Patient Advocate Encounter  Received notification from [employer] that Prior Authorization for AJOVY  (fremanezumab -vfrm) injection 225MG /1.5ML auto-injectors has been APPROVED from 12/05/2023 to 12/04/2024. Ran test claim, Copay is $24.98. This test claim was processed through [facility]- copay amounts may vary at other pharmacies due to pharmacy/plan contracts, or as the patient moves through the different stages of their insurance plan.   PA #/Case ID/Reference #: PA Case ID #: [id number]

## 2024-03-29 ENCOUNTER — Ambulatory Visit: Payer: Medicare Other | Admitting: Neurology

## 2024-04-29 ENCOUNTER — Other Ambulatory Visit: Payer: Self-pay | Admitting: Cardiovascular Disease

## 2024-07-24 ENCOUNTER — Ambulatory Visit: Attending: Cardiovascular Disease | Admitting: Cardiovascular Disease

## 2024-07-24 ENCOUNTER — Encounter: Payer: Self-pay | Admitting: Cardiovascular Disease

## 2024-07-24 VITALS — BP 120/71 | HR 71 | Resp 16 | Ht <= 58 in | Wt 139.4 lb

## 2024-07-24 DIAGNOSIS — I5032 Chronic diastolic (congestive) heart failure: Secondary | ICD-10-CM | POA: Diagnosis not present

## 2024-07-24 DIAGNOSIS — I10 Essential (primary) hypertension: Secondary | ICD-10-CM

## 2024-07-24 DIAGNOSIS — Z720 Tobacco use: Secondary | ICD-10-CM

## 2024-07-24 DIAGNOSIS — I2510 Atherosclerotic heart disease of native coronary artery without angina pectoris: Secondary | ICD-10-CM | POA: Diagnosis not present

## 2024-07-24 DIAGNOSIS — E785 Hyperlipidemia, unspecified: Secondary | ICD-10-CM | POA: Diagnosis not present

## 2024-07-24 NOTE — Progress Notes (Signed)
 Cardiology Office Note   Date:  07/24/2024   ID:  [name], DOB August 18, 1966, MRN [id number]  PCP:  Christine Perkins, NP  Cardiologist:   Deatrice Cage, MD   Chief Complaint  Patient presents with   Coronary artery disease involving native coronary artery of   Follow-up       History of Present Illness: [name] is a 58 y.o. female who presents for a followup visit regarding coronary artery disease. She is status post inferior myocardial infarction in July 2010. At that time she had an angioplasty and bare-metal stent placement to the right coronary artery.  Most recent stress test was in August of 2014  showed no evidence of ischemia with normal ejection fraction.  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 She is on longterm Fentanyl  patch. She is on insulin  pump for diabetes. Echocardiogram in December 2018 showed normal LV systolic function but there was evidence of diastolic dysfunction with elevated filling pressures.  Lower extremity arterial Doppler in 12/2017 showed normal ABI bilaterally.  She had recurrent angina and shortness of breath in 2022.  A right and left cardiac catheterization was done in March, 2022 which showed significant one-vessel coronary artery disease due to in-stent restenosis in the proximal right coronary artery, moderate distal RCA disease and right PDA disease and mild left main and LAD disease.  Ejection fraction was normal.  Right heart catheterization showed moderately elevated filling pressures in the setting of severe hypertension, mild to moderate pulmonary hypertension and normal cardiac output.  I performed successful drug-eluting stent placement to the right coronary artery for in-stent restenosis.  She had an echocardiogram in June 2023  which showed normal LV systolic function with grade 2 diastolic dysfunction and no significant valvular abnormalities.  She was hospitalized in 2023 with severe headache.   She was diagnosed with 6th nerve palsy .  She has been doing well with no chest pain or worsening dyspnea.  Unfortunately, she continues to smoke.  She reports easy bruising with dual antiplatelet therapy.   Past Medical History:  Diagnosis Date   CAD (coronary artery disease)    s/p MI & stenting   DM2 (diabetes mellitus, type 2) (HCC)    Guillain Barr syndrome (HCC)    HLD (hyperlipidemia)    HTN (hypertension)    MI (myocardial infarction) (HCC)    Sympathetic reflex dystrophy    Tobacco abuse     Past Surgical History:  Procedure Laterality Date   CARDIAC CATHETERIZATION  06/05/2009   Inferior MI. Severe 1 vessel CAD (RCA)   CARPAL TUNNEL RELEASE  2002   left   CHOLECYSTECTOMY  2006   CORONARY ANGIOPLASTY WITH STENT PLACEMENT  06/05/2009   BMS (4.0 X 23 mm) Vision stent to RCA   CORONARY IMAGING/OCT N/A 02/27/2021   Procedure: INTRAVASCULAR IMAGING/OCT;  Surgeon: Cage Deatrice LABOR, MD;  Location: [facility];  Service: Cardiovascular;  Laterality: N/A;   CORONARY STENT PLACEMENT  2010   CORONARY STENT PLACEMENT  02/27/2021   DILATION AND CURETTAGE OF UTERUS  1992 & 2004   RIGHT/LEFT HEART CATH AND CORONARY ANGIOGRAPHY N/A 02/27/2021   Procedure: RIGHT/LEFT HEART CATH AND CORONARY ANGIOGRAPHY;  Surgeon: Cage Deatrice LABOR, MD;  Location: [facility];  Service: Cardiovascular;  Laterality: N/A;   TONSILLECTOMY  1971   TUBAL LIGATION  2005     Current Outpatient Medications  Medication Sig Dispense Refill   amLODipine  (NORVASC ) 10 MG  tablet Take 10 mg by mouth daily.     aspirin  81 MG tablet Take 81 mg by mouth daily.     atorvastatin  (LIPITOR ) 80 MG tablet Take 1 tablet (80 mg total) by mouth every evening. 30 tablet 6   carvedilol  (COREG ) 12.5 MG tablet Take 1 tablet (12.5 mg total) by mouth 2 (two) times daily with a meal. 180 tablet 3   clopidogrel  (PLAVIX ) 75 MG tablet Take 1 tablet (75 mg total) by mouth daily with breakfast. 90 tablet 3   Continuous  Blood Gluc Receiver (DEXCOM G6 RECEIVER) DEVI See admin instructions.     Continuous Blood Gluc Sensor (DEXCOM G6 SENSOR) MISC See admin instructions.     Continuous Blood Gluc Transmit (DEXCOM G6 TRANSMITTER) MISC See admin instructions.     DULoxetine  (CYMBALTA ) 60 MG capsule Take 60 mg by mouth daily.     ezetimibe (ZETIA) 10 MG tablet Take 10 mg by mouth daily.     FARXIGA  10 MG TABS tablet Take 10 mg by mouth daily.     fentaNYL  (DURAGESIC ) 25 MCG/HR Place 1 patch onto the skin every other day.     Fremanezumab -vfrm (AJOVY ) 225 MG/1.5ML SOAJ One pen subcutanously every 4 weeks 1.68 mL 11   furosemide  (LASIX ) 20 MG tablet Take 20 mg by mouth 2 (two) times daily.     glycopyrrolate  (ROBINUL ) 1 MG tablet Take 1 mg by mouth daily as needed (sweating).     Insulin  Disposable Pump (OMNIPOD 5 PACK) MISC      loratadine (CLARITIN) 10 MG tablet Take 10 mg by mouth daily.     olmesartan (BENICAR) 40 MG tablet Take 40 mg by mouth daily.     Oxycodone  HCl 10 MG TABS Take 10 mg by mouth 3 (three) times daily.     OZEMPIC, 1 MG/DOSE, 4 MG/3ML SOPN Inject 1 mg into the skin once a week.     pantoprazole  (PROTONIX ) 40 MG tablet Take 40 mg by mouth daily.     UBRELVY 100 MG TABS Take 100 mg by mouth daily as needed.     vitamin B-12 (CYANOCOBALAMIN ) 50 MCG tablet Take 50 mcg by mouth daily.     Vitamin D , Ergocalciferol , (DRISDOL ) 1.25 MG (50000 UNIT) CAPS capsule Take 50,000 Units by mouth 2 (two) times a week.     No current facility-administered medications for this visit.    Allergies:   Patient has no known allergies.    Social History:  The patient  reports that she has been smoking cigarettes. She has a 25 pack-year smoking history. She has never used smokeless tobacco. She reports that she does not drink alcohol  and does not use drugs.   Family History:  The patient's family history includes Arrhythmia in her father; Huntington's disease in her mother and another family member; Hyperlipidemia  in her father; Hypertension in her father.    ROS:  Please see the history of present illness.   Otherwise, review of systems are positive for none.   All other systems are reviewed and negative.    PHYSICAL EXAM: VS:  BP 120/71 (BP Location: Right Arm, Patient Position: Sitting)   Pulse 71   Resp 16   Ht 4' 10 (1.473 m)   Wt 139 lb 6.4 oz (63.2 kg)   SpO2 97%   BMI 29.13 kg/m  , BMI Body mass index is 29.13 kg/m. GEN: Well nourished, well developed, in no acute distress  HEENT: normal  Neck: no JVD, carotid bruits,  or masses Cardiac: RRR; no  rubs, or gallops,no edema . There is 2/6 systolic ejection murmur in the aortic area and Respiratory:  clear to auscultation bilaterally, normal work of breathing GI: soft, nontender, nondistended, + BS MS: no deformity or atrophy  Skin: warm and dry, no rash Neuro:  Strength and sensation are intact Psych: euthymic mood, full affect   EKG:  EKG is ordered today. The ekg ordered today demonstrates: Normal sinus rhythm Normal ECG When compared with ECG of 31-May-2023 10:11, Premature ventricular complexes are no longer Present    Recent Labs: No results found for requested labs within last 365 days.    Lipid Panel No results found for: CHOL, TRIG, HDL, CHOLHDL, VLDL, LDLCALC, LDLDIRECT    Wt Readings from Last 3 Encounters:  07/24/24 139 lb 6.4 oz (63.2 kg)  05/31/23 180 lb 9.6 oz (81.9 kg)  03/30/23 181 lb 8 oz (82.3 kg)        ASSESSMENT AND PLAN:  1.  Coronary artery disease involving native coronary arteries without angina: She is doing very well overall with no anginal symptoms.  It has been more than 3 years since her most recent stent placement.  Given extensive bruising, will discontinue clopidogrel  and continue aspirin  indefinitely.  2.   Chronic diastolic heart failure: She appears to be euvolemic on current dose of furosemide  20 mg twice daily.   3. Essential hypertension: Blood pressure is  well-controlled on current medications.  3. Hyperlipidemia: Continue high dose atorvastatin  and ezetimibe with a target LDL of less than 70.  4. Tobacco use: I discussed with her the importance of smoking cessation.  Unfortunately, she is smoking 1 pack/day again.  She reports inability to quit.    Disposition:   FU with me in 12 months.  Signed,  Deatrice Cage, MD  07/24/2024 10:46 AM    [facility]

## 2024-07-24 NOTE — Patient Instructions (Signed)
 Medication Instructions:  STOP the Clopidogrel  (Plavix )  *If you need a refill on your cardiac medications before your next appointment, please call your pharmacy*  Lab Work: None ordered If you have labs (blood work) drawn today and your tests are completely normal, you will receive your results only by: MyChart Message (if you have MyChart) OR A paper copy in the mail If you have any lab test that is abnormal or we need to change your treatment, we will call you to review the results.  Testing/Procedures: None ordered  Follow-Up: At [facility], you and your health needs are our priority.  As part of our continuing mission to provide you with exceptional heart care, our providers are all part of one team.  This team includes your primary Cardiologist (physician) and Advanced Practice Providers or APPs (Physician Assistants and Nurse Practitioners) who all work together to provide you with the care you need, when you need it.  Your next appointment:   12 month(s)  Provider:   Deatrice Cage, MD    We recommend signing up for the patient portal called MyChart.  Sign up information is provided on this After Visit Summary.  MyChart is used to connect with patients for Virtual Visits (Telemedicine).  Patients are able to view lab/test results, encounter notes, upcoming appointments, etc.  Non-urgent messages can be sent to your provider as well.   To learn more about what you can do with MyChart, go to [email or web address].
